# Patient Record
Sex: Female | Born: 1945 | Race: White | Hispanic: No | Smoking: Never smoker
Health system: Southern US, Community
[De-identification: ages and names within clinical notes are randomized; demographics above are authoritative.]

## PROBLEM LIST (undated history)

## (undated) DIAGNOSIS — I639 Cerebral infarction, unspecified: Secondary | ICD-10-CM

## (undated) DIAGNOSIS — T7840XA Allergy, unspecified, initial encounter: Secondary | ICD-10-CM

## (undated) DIAGNOSIS — F419 Anxiety disorder, unspecified: Secondary | ICD-10-CM

## (undated) DIAGNOSIS — I10 Essential (primary) hypertension: Secondary | ICD-10-CM

## (undated) DIAGNOSIS — Z8489 Family history of other specified conditions: Secondary | ICD-10-CM

## (undated) HISTORY — PX: OTHER SURGICAL HISTORY: SHX169

## (undated) HISTORY — DX: Allergy, unspecified, initial encounter: T78.40XA

## (undated) HISTORY — PX: BUNIONECTOMY: SHX129

## (undated) HISTORY — PX: BREAST SURGERY: SHX581

## (undated) HISTORY — PX: FRACTURE SURGERY: SHX138

## (undated) HISTORY — DX: Cerebral infarction, unspecified: I63.9

## (undated) HISTORY — DX: Anxiety disorder, unspecified: F41.9

## (undated) HISTORY — DX: Essential (primary) hypertension: I10

## (undated) HISTORY — PX: REDUCTION MAMMAPLASTY: SUR839

## (undated) HISTORY — PX: COSMETIC SURGERY: SHX468

---

## 1988-11-18 DIAGNOSIS — I639 Cerebral infarction, unspecified: Secondary | ICD-10-CM

## 1988-11-18 HISTORY — DX: Cerebral infarction, unspecified: I63.9

## 1991-11-19 HISTORY — PX: BREAST REDUCTION SURGERY: SHX8

## 2003-11-19 HISTORY — PX: ABDOMINOPLASTY: SUR9

## 2007-12-10 ENCOUNTER — Ambulatory Visit (HOSPITAL_BASED_OUTPATIENT_CLINIC_OR_DEPARTMENT_OTHER): Admission: RE | Admit: 2007-12-10 | Discharge: 2007-12-11 | Payer: Self-pay | Admitting: Orthopedic Surgery

## 2007-12-10 ENCOUNTER — Encounter (INDEPENDENT_AMBULATORY_CARE_PROVIDER_SITE_OTHER): Payer: Self-pay | Admitting: Orthopedic Surgery

## 2011-04-02 NOTE — Op Note (Signed)
Debra Roth, Debra Roth              ACCOUNT NO.:  [id number]   MEDICAL RECORD NO.:  [id number]          PATIENT TYPE:  AMB   LOCATION:  [facility]                         FACILITY:  [facility]   PHYSICIAN:  Deidre Ala, M.D.    DATE OF BIRTH:  1946-10-01   DATE OF PROCEDURE:  12/10/2007  DATE OF DISCHARGE:                               OPERATIVE REPORT   PREOPERATIVE DIAGNOSES:  1. 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  2. Second and third metatarsalgia over long second and third toes with      transfer lesion.  3. Morton's neuroma II-III again bilaterally.   POSTOPERATIVE DIAGNOSES:  1. 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  2. Second and third metatarsalgia over long second and third toes with      transfer lesion.  3. Morton's neuroma II-III again bilaterally.   PROCEDURE:  1. Right bunionectomy with first metatarsal opening wedge osteotomy      with exostosis as autograph with placement of Synthes locking LCP      plate.  2. Right second dorsal wedge osteotomy with local autograft.  3. Right third dorsal wedge osteotomy metatarsal neck with autograft.  4. Right 2-3 Morton's neurectomy.  5. Left bunionectomy with first metatarsal opening wedge osteotomy      with placement of Synthes locking LCP plate.  6. Second dorsal wedge osteotomy metatarsal neck with local autograft.  7. Third dorsal wedge osteotomy metatarsal neck with local autograft,  8. Morton's neurectomy left second and third interspace.   SURGEON:  Doristine Section, M.D.   ASSISTANT:  Phineas Semen, P.A.-C.   ANESTHESIA:  General with LMA with local block of Marcaine  postoperative.   ESTIMATED BLOOD LOSS:  Minimal.   TOURNIQUET TIME:  Right 1 hour 44 minutes; left 1 hour 40 minutes.   PATHOLOGIC FINDINGS AND HISTORY:  Miyako presented with these feet.  She is from [place] and is a friend of one my patient's.  She wanted  something through the holidays for  help with her second and third  metatarsal areas, and I injected them.  We evaluated her overall  situation, and she had metatarsus primus varus bilaterally about 13.5  degrees over long second and third toes with shortening of the first  ray.  There was some concentric subluxation of the MTP joint but still  intact with a large exostosis again without MTP joint osteoarthritis.  We talked about procedures, and she elected to proceed with the above-  listed procedure with all aspects being covered with the patient as well  as any alternatives.  She elected to go with the above procedure.  Prior  to surgery today, she complained of numbness in the second and third  interspace with direct pinch and pressure together between the second  and third.  In addition, pressure under the second and third dorsal  callosities indicating that in addition to second and third  metatarsalgia, she also had interdigital neuritis between 2 and 3.  She  desired to proceed with Morton's neurectomy after thorough explanation  of the issues with that procedure.  At surgery, no untoward features  were noted.  We were used intraoperative fluoro, and we obtained  excellent position and alignment with a dorsomedially placed LCP locking  Synthes plate with a total of 5 screws with the osteotomy at the  proximal one-third on both sides opening wedge using the exostosis on  both sides as the graft for the opening wedge with dorsal wedge  osteotomies of 2 and 3 with local autograft placed.  There was no  significant hammertoes that needed to be dealt with, and an overall  alignment that was achieved was very good on C-arm fluoroscopy on AP and  lateral view.   PROCEDURE:  With adequate anesthesia obtained using LMA technique, 1 g  Ancef given IV prophylaxis after the first case and the next one after  the second case.  The patient was placed in the supine position.  Both  lower extremities from the toes to the upper  calf were prepped and  draped in standard fashion.  After standard prepping and draping,  Esmarch examination was used on the right.  An incision was then made  over the dorsomedial capsule of the MTP joint and brought up proximally  up the metatarsal.  Incision was deepened sharply with a knife and  hemostasis obtained using the Bovie electrocoagulator.  A capsular flap  placed distal was then raised over the exostosis, and the exostosis was  removed in piecemeal fashion but basically shaved it first to be the  equivalent of a 10-degree opening wedge autograft for the osteotomy and  then the rest of the exostosis trimmed as well as the dorsal osteophyte.  This was reserved for later use.  I then made an incision at the base of  the proximal third of the diaphyseal metaphyseal junction after placing  the plate on to check positioning and left the lateral cortex open the  wedge osteotomy and placed the wedge of bone in, producing valgus from  the metatarsus primus varus original position using the exostosis wedge.  The plate was then affixed with locking screws under C-arm fluoroscopy  with 2 screws distal and 3 screws proximal.  We then did an abductor  release as per the McBride procedure with a dorsal web incision between  1 and 2 with a Beaver blade releasing the abductor.  Attention was then  turned to the second and third where a dorsal web incision was made with  dissection down through to the inner metatarsal ligament which was then  spread with a lamina spreader.  I then released the inner metatarsal  ligament exposing the digital nerve at its bifurcation.  I then  dissected the nerve proximal, pulled it distal, cut it with a Beaver  blade and allowed to retract and then resected the distal ends and sent  it for specimen.  I then did dorsal wedge osteotomies of 2 and 3 volts  at the neck, cracking the metatarsal heads up without fixation and using  morselized bone graft from the  wedge at the site.  Additional bone graft  was placed around the proximal first metatarsal.  Irrigation was carried  out.  All wounds were closed with running 4-0 nylon stitches interrupted  and running.  A bulky sterile compressive forefoot dressing was applied  and then up above the heel with slight padding between the first and  second and a great toe spica.  Quarter percent Marcaine was placed in  and about the wounds.  The patient having tolerated this  procedure had  the tourniquet let down, and the procedure was then carried out in  exactly the same fashion on the left side.  The  patient then having tolerated both procedures well was taken to the  operating room to be admitted for overnight stay in her Cam walker  boots, elevation, crutches, weightbearing as tolerated on her heels, and  told to call the office for recheck on Monday.           ______________________________  V. Charlesetta Shanks, M.D.     VEP/MEDQ  D:  12/10/2007  T:  12/10/2007  Job:  [id number]   cc:   Deidre Ala, M.D.  Fax: [phone]   Gwynneth Munson, M.D.  [place], [place]

## 2011-08-08 LAB — I-STAT 8, (EC8 V) (CONVERTED LAB)
Acid-Base Excess: 3 — ABNORMAL HIGH
BUN: 20
Bicarbonate: 28.2 — ABNORMAL HIGH
Chloride: 109
HCT: 44
Hemoglobin: 15
Operator id: 114531
Sodium: 138
pCO2, Ven: 45.4

## 2011-12-18 DIAGNOSIS — F339 Major depressive disorder, recurrent, unspecified: Secondary | ICD-10-CM | POA: Diagnosis not present

## 2012-01-08 DIAGNOSIS — G245 Blepharospasm: Secondary | ICD-10-CM | POA: Diagnosis not present

## 2012-01-16 DIAGNOSIS — I10 Essential (primary) hypertension: Secondary | ICD-10-CM | POA: Diagnosis not present

## 2012-01-16 DIAGNOSIS — E785 Hyperlipidemia, unspecified: Secondary | ICD-10-CM | POA: Diagnosis not present

## 2012-01-16 DIAGNOSIS — R12 Heartburn: Secondary | ICD-10-CM | POA: Diagnosis not present

## 2012-02-14 DIAGNOSIS — B0223 Postherpetic polyneuropathy: Secondary | ICD-10-CM | POA: Diagnosis not present

## 2012-02-14 DIAGNOSIS — B029 Zoster without complications: Secondary | ICD-10-CM | POA: Diagnosis not present

## 2012-02-19 DIAGNOSIS — L821 Other seborrheic keratosis: Secondary | ICD-10-CM | POA: Diagnosis not present

## 2012-04-08 DIAGNOSIS — G245 Blepharospasm: Secondary | ICD-10-CM | POA: Diagnosis not present

## 2012-05-20 DIAGNOSIS — R21 Rash and other nonspecific skin eruption: Secondary | ICD-10-CM | POA: Diagnosis not present

## 2012-05-20 DIAGNOSIS — I10 Essential (primary) hypertension: Secondary | ICD-10-CM | POA: Diagnosis not present

## 2012-05-20 DIAGNOSIS — Z79899 Other long term (current) drug therapy: Secondary | ICD-10-CM | POA: Diagnosis not present

## 2012-05-20 DIAGNOSIS — E785 Hyperlipidemia, unspecified: Secondary | ICD-10-CM | POA: Diagnosis not present

## 2012-07-01 DIAGNOSIS — G245 Blepharospasm: Secondary | ICD-10-CM | POA: Diagnosis not present

## 2012-07-10 DIAGNOSIS — F339 Major depressive disorder, recurrent, unspecified: Secondary | ICD-10-CM | POA: Diagnosis not present

## 2012-09-23 DIAGNOSIS — G245 Blepharospasm: Secondary | ICD-10-CM | POA: Diagnosis not present

## 2012-11-04 DIAGNOSIS — I10 Essential (primary) hypertension: Secondary | ICD-10-CM | POA: Diagnosis not present

## 2012-11-04 DIAGNOSIS — E785 Hyperlipidemia, unspecified: Secondary | ICD-10-CM | POA: Diagnosis not present

## 2013-01-06 DIAGNOSIS — M5126 Other intervertebral disc displacement, lumbar region: Secondary | ICD-10-CM | POA: Diagnosis not present

## 2013-01-07 DIAGNOSIS — G245 Blepharospasm: Secondary | ICD-10-CM | POA: Diagnosis not present

## 2013-02-04 DIAGNOSIS — F339 Major depressive disorder, recurrent, unspecified: Secondary | ICD-10-CM | POA: Diagnosis not present

## 2013-02-11 DIAGNOSIS — R404 Transient alteration of awareness: Secondary | ICD-10-CM | POA: Diagnosis not present

## 2013-02-11 DIAGNOSIS — E785 Hyperlipidemia, unspecified: Secondary | ICD-10-CM | POA: Diagnosis not present

## 2013-02-11 DIAGNOSIS — Z8679 Personal history of other diseases of the circulatory system: Secondary | ICD-10-CM | POA: Diagnosis not present

## 2013-02-11 DIAGNOSIS — R4182 Altered mental status, unspecified: Secondary | ICD-10-CM | POA: Diagnosis not present

## 2013-02-11 DIAGNOSIS — S060X9A Concussion with loss of consciousness of unspecified duration, initial encounter: Secondary | ICD-10-CM | POA: Diagnosis not present

## 2013-02-11 DIAGNOSIS — S060X1A Concussion with loss of consciousness of 30 minutes or less, initial encounter: Secondary | ICD-10-CM | POA: Diagnosis not present

## 2013-02-11 DIAGNOSIS — R51 Headache: Secondary | ICD-10-CM | POA: Diagnosis not present

## 2013-02-11 DIAGNOSIS — R55 Syncope and collapse: Secondary | ICD-10-CM | POA: Diagnosis not present

## 2013-02-11 DIAGNOSIS — W010XXA Fall on same level from slipping, tripping and stumbling without subsequent striking against object, initial encounter: Secondary | ICD-10-CM | POA: Diagnosis not present

## 2013-02-11 DIAGNOSIS — Z79899 Other long term (current) drug therapy: Secondary | ICD-10-CM | POA: Diagnosis not present

## 2013-02-11 DIAGNOSIS — I10 Essential (primary) hypertension: Secondary | ICD-10-CM | POA: Diagnosis not present

## 2013-04-08 DIAGNOSIS — G245 Blepharospasm: Secondary | ICD-10-CM | POA: Diagnosis not present

## 2013-04-26 DIAGNOSIS — J309 Allergic rhinitis, unspecified: Secondary | ICD-10-CM | POA: Diagnosis not present

## 2013-04-26 DIAGNOSIS — J019 Acute sinusitis, unspecified: Secondary | ICD-10-CM | POA: Diagnosis not present

## 2013-07-06 DIAGNOSIS — G245 Blepharospasm: Secondary | ICD-10-CM | POA: Diagnosis not present

## 2013-07-16 DIAGNOSIS — I10 Essential (primary) hypertension: Secondary | ICD-10-CM | POA: Diagnosis not present

## 2013-07-16 DIAGNOSIS — G245 Blepharospasm: Secondary | ICD-10-CM | POA: Diagnosis not present

## 2013-07-16 DIAGNOSIS — R12 Heartburn: Secondary | ICD-10-CM | POA: Diagnosis not present

## 2013-07-16 DIAGNOSIS — F329 Major depressive disorder, single episode, unspecified: Secondary | ICD-10-CM | POA: Diagnosis not present

## 2013-07-16 DIAGNOSIS — K589 Irritable bowel syndrome without diarrhea: Secondary | ICD-10-CM | POA: Diagnosis not present

## 2013-07-16 DIAGNOSIS — M899 Disorder of bone, unspecified: Secondary | ICD-10-CM | POA: Diagnosis not present

## 2013-07-16 DIAGNOSIS — E785 Hyperlipidemia, unspecified: Secondary | ICD-10-CM | POA: Diagnosis not present

## 2013-07-16 DIAGNOSIS — I6789 Other cerebrovascular disease: Secondary | ICD-10-CM | POA: Diagnosis not present

## 2013-07-16 DIAGNOSIS — I669 Occlusion and stenosis of unspecified cerebral artery: Secondary | ICD-10-CM | POA: Diagnosis not present

## 2013-10-05 DIAGNOSIS — G245 Blepharospasm: Secondary | ICD-10-CM | POA: Diagnosis not present

## 2013-11-02 DIAGNOSIS — F329 Major depressive disorder, single episode, unspecified: Secondary | ICD-10-CM | POA: Diagnosis not present

## 2013-11-02 DIAGNOSIS — I10 Essential (primary) hypertension: Secondary | ICD-10-CM | POA: Diagnosis not present

## 2013-11-02 DIAGNOSIS — E785 Hyperlipidemia, unspecified: Secondary | ICD-10-CM | POA: Diagnosis not present

## 2013-11-03 DIAGNOSIS — F339 Major depressive disorder, recurrent, unspecified: Secondary | ICD-10-CM | POA: Diagnosis not present

## 2014-01-19 DIAGNOSIS — G245 Blepharospasm: Secondary | ICD-10-CM | POA: Diagnosis not present

## 2014-05-11 DIAGNOSIS — G245 Blepharospasm: Secondary | ICD-10-CM | POA: Diagnosis not present

## 2014-06-03 ENCOUNTER — Ambulatory Visit (INDEPENDENT_AMBULATORY_CARE_PROVIDER_SITE_OTHER): Payer: Medicare Other | Admitting: Family Medicine

## 2014-06-03 VITALS — BP 142/90 | HR 69 | Temp 97.6°F | Resp 16 | Ht 61.25 in | Wt 158.0 lb

## 2014-06-03 DIAGNOSIS — K219 Gastro-esophageal reflux disease without esophagitis: Secondary | ICD-10-CM

## 2014-06-03 DIAGNOSIS — G479 Sleep disorder, unspecified: Secondary | ICD-10-CM

## 2014-06-03 DIAGNOSIS — F4323 Adjustment disorder with mixed anxiety and depressed mood: Secondary | ICD-10-CM

## 2014-06-03 DIAGNOSIS — I10 Essential (primary) hypertension: Secondary | ICD-10-CM | POA: Diagnosis not present

## 2014-06-03 DIAGNOSIS — E785 Hyperlipidemia, unspecified: Secondary | ICD-10-CM | POA: Insufficient documentation

## 2014-06-03 DIAGNOSIS — R197 Diarrhea, unspecified: Secondary | ICD-10-CM | POA: Diagnosis not present

## 2014-06-03 LAB — COMPLETE METABOLIC PANEL WITH GFR
ALT: 20 U/L (ref 0–35)
AST: 21 U/L (ref 0–37)
Albumin: 4.4 g/dL (ref 3.5–5.2)
Alkaline Phosphatase: 56 U/L (ref 39–117)
BILIRUBIN TOTAL: 0.5 mg/dL (ref 0.2–1.2)
BUN: 24 mg/dL — AB (ref 6–23)
CO2: 27 mEq/L (ref 19–32)
Calcium: 9.3 mg/dL (ref 8.4–10.5)
Chloride: 103 mEq/L (ref 96–112)
Creat: 0.86 mg/dL (ref 0.50–1.10)
GFR, EST NON AFRICAN AMERICAN: 70 mL/min
GFR, Est African American: 80 mL/min
GLUCOSE: 73 mg/dL (ref 70–99)
Potassium: 4.4 mEq/L (ref 3.5–5.3)
SODIUM: 138 meq/L (ref 135–145)
TOTAL PROTEIN: 7.2 g/dL (ref 6.0–8.3)

## 2014-06-03 LAB — CBC WITH DIFFERENTIAL/PLATELET
Basophils Absolute: 0 10*3/uL (ref 0.0–0.1)
Basophils Relative: 0 % (ref 0–1)
Eosinophils Absolute: 0.1 10*3/uL (ref 0.0–0.7)
Eosinophils Relative: 2 % (ref 0–5)
HCT: 42 % (ref 36.0–46.0)
HEMOGLOBIN: 14.7 g/dL (ref 12.0–15.0)
Lymphocytes Relative: 35 % (ref 12–46)
Lymphs Abs: 2.2 10*3/uL (ref 0.7–4.0)
MCH: 31.8 pg (ref 26.0–34.0)
MCHC: 35 g/dL (ref 30.0–36.0)
MCV: 90.9 fL (ref 78.0–100.0)
MONOS PCT: 7 % (ref 3–12)
Monocytes Absolute: 0.4 10*3/uL (ref 0.1–1.0)
Neutro Abs: 3.5 10*3/uL (ref 1.7–7.7)
Neutrophils Relative %: 56 % (ref 43–77)
PLATELETS: 167 10*3/uL (ref 150–400)
RBC: 4.62 MIL/uL (ref 3.87–5.11)
RDW: 13.1 % (ref 11.5–15.5)
WBC: 6.2 10*3/uL (ref 4.0–10.5)

## 2014-06-03 LAB — LIPID PANEL
CHOL/HDL RATIO: 2.7 ratio
CHOLESTEROL: 185 mg/dL (ref 0–200)
HDL: 68 mg/dL (ref >39)
LDL Cholesterol: 86 mg/dL (ref 0–99)
Triglycerides: 155 mg/dL — ABNORMAL HIGH (ref <150)
VLDL: 31 mg/dL (ref 0–40)

## 2014-06-03 MED ORDER — DIAZEPAM 5 MG PO TABS: 5.0000 mg | ORAL_TABLET | Freq: Two times a day (BID) | ORAL | Status: DC | PRN

## 2014-06-03 MED ORDER — RAMIPRIL 5 MG PO CAPS: 5.0000 mg | ORAL_CAPSULE | Freq: Every day | ORAL | Status: DC

## 2014-06-03 MED ORDER — BUPROPION HCL ER (XL) 150 MG PO TB24: 150.0000 mg | ORAL_TABLET | Freq: Every day | ORAL | Status: DC

## 2014-06-03 MED ORDER — METOPROLOL SUCCINATE ER 25 MG PO TB24: 25.0000 mg | ORAL_TABLET | Freq: Every day | ORAL | Status: DC

## 2014-06-03 MED ORDER — PRAVASTATIN SODIUM 20 MG PO TABS: 20.0000 mg | ORAL_TABLET | Freq: Every day | ORAL | Status: DC

## 2014-06-03 NOTE — Progress Notes (Signed)
Subjective: Patient is here for her first time. She needs a refill of her medications. She moved from [place] here 1-1/2 years ago. She had a niece here. She used to live in this area when she was married to her husband who is a Engineer, maintenance at [employer]. They got divorced and she felt it was best to get away from [place]. Since she is being here she has been fairly much of a hermit. 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 She goes down to [place] usually to see her doctor. She needs medicines refilled. She is depressed because her birthday is next week. They usually celebrated at [employer] which they have now so because of the divorce. It is an emotional time for her. She's been feeling okay otherwise. She has not get into any activities here in his community.  She takes number of medications. She is on medicine for blood pressure and cholesterol. She takes Valium for sleep. She's been having chronic diarrhea and goes about half a dozen times a day for a long time. 5 years ago she had a normal colonoscopy. She takes Imodium. She is on Wellbutrin for depression and anxiety. She takes omeprazole  over-the-counter for her GERD. She takes over-the-counter antihistamines  Objective: Healthy appearing lady who is depressed. She cried some. TMs normal. Throat clear. Neck supple without nodes thyromegaly. Chest clear. Heart regular without murmurs. Abdomen soft without mass or tenderness. Scars from a previous tummy tuck. Extremities unremarkable.  Assessment: Depression and anxiety Hypertension Hyperlipidemia Chronic diarrhea GERD Allergic rhinitis  Plan: Continue her current medications. See her back in November, sooner if problems. Next is I may be able to change to 90 day prescriptions for her, but did not want to give long-term yet. Urged her to get involved in the community and make [place] her home. Needs to get involved in church or social affairs to get  more involved. Urged to get regular exercise. Return anytime if needed.

## 2014-06-03 NOTE — Patient Instructions (Addendum)
Continue current medication  Referral is being made to a gastroenterologist for evaluation of the diarrhea  I will let you know the results of the laboratory tests sometime next week  Return at any time if worse  Try to get outside and do some regular walking  Return early November

## 2014-06-04 LAB — TSH: TSH: 1.462 u[IU]/mL (ref 0.350–4.500)

## 2014-06-06 ENCOUNTER — Telehealth: Payer: Self-pay

## 2014-06-06 DIAGNOSIS — F4323 Adjustment disorder with mixed anxiety and depressed mood: Secondary | ICD-10-CM

## 2014-06-06 DIAGNOSIS — I10 Essential (primary) hypertension: Secondary | ICD-10-CM

## 2014-06-06 DIAGNOSIS — G479 Sleep disorder, unspecified: Secondary | ICD-10-CM

## 2014-06-06 NOTE — Telephone Encounter (Signed)
metoprolol succinate (TOPROL-XL) 25 MG 24 hr tablet   Patient has not taken the extended release before,  Wants to confirms this is correct, the one she normally takes from [place] was not extended.     [phone]

## 2014-06-07 ENCOUNTER — Encounter: Payer: Self-pay | Admitting: Family Medicine

## 2014-06-07 MED ORDER — METOPROLOL TARTRATE 25 MG PO TABS: 25.0000 mg | ORAL_TABLET | Freq: Every day | ORAL | Status: DC

## 2014-06-07 NOTE — Telephone Encounter (Signed)
Spoke with pt. She says that we sent in Metoprolol XL instead of Metoprolol Tartrate. Changed for her and sent in the new rx. Pt asked about her labs. Let her know that they were normal and a letter was sent.   Pt also needs a RF of her Diazepam. Can we RF?

## 2014-06-07 NOTE — Telephone Encounter (Signed)
Patient states she has not heard anything back from [place] about previous message and medication. States she needs to know whether or not she should be taking the medication. CB # [phone]

## 2014-06-10 MED ORDER — DIAZEPAM 5 MG PO TABS: 5.0000 mg | ORAL_TABLET | Freq: Two times a day (BID) | ORAL | Status: DC | PRN

## 2014-06-10 NOTE — Telephone Encounter (Signed)
Rx in drawer for p/up.

## 2014-06-10 NOTE — Telephone Encounter (Signed)
Printed Rx for Diazepam for Dr. Linna Darner to sign.  Pt will pick up later today.

## 2014-06-10 NOTE — Telephone Encounter (Signed)
Okay to give him one refill on her diazepam

## 2014-07-19 ENCOUNTER — Telehealth: Payer: Self-pay

## 2014-07-19 NOTE — Telephone Encounter (Signed)
Patient called to see if her written prescription for "Diazepam" was still up front for her to pick up from 06/10/14. I informed patient that it was no longer up front, and it may have been shredded since it was over 30 days. Patient requesting to [facility] [address] [phone]. Patients call back number is [phone]

## 2014-07-21 ENCOUNTER — Other Ambulatory Visit: Payer: Self-pay | Admitting: Family Medicine

## 2014-07-21 DIAGNOSIS — F4323 Adjustment disorder with mixed anxiety and depressed mood: Secondary | ICD-10-CM

## 2014-07-21 DIAGNOSIS — G479 Sleep disorder, unspecified: Secondary | ICD-10-CM

## 2014-07-21 MED ORDER — DIAZEPAM 5 MG PO TABS: 5.0000 mg | ORAL_TABLET | Freq: Two times a day (BID) | ORAL | Status: DC | PRN

## 2014-07-21 NOTE — Telephone Encounter (Signed)
Pt called in for 3rd time regarding the Diazepam. It looks like in the encounters this was [facility] [place] on [facility], Pt states that she had not yet called the pharmacy about this rx, and would give it a try. I told her that if the rx is not at the pharmacy there is a message in the system for review by Dr. Linna Darner.

## 2014-07-21 NOTE — Telephone Encounter (Signed)
Informed the patient that we have provided a refill for her, but after this when she needs to be seen if she is requiring further medication.

## 2014-08-03 DIAGNOSIS — G245 Blepharospasm: Secondary | ICD-10-CM | POA: Diagnosis not present

## 2014-08-03 DIAGNOSIS — F329 Major depressive disorder, single episode, unspecified: Secondary | ICD-10-CM | POA: Diagnosis not present

## 2014-08-03 DIAGNOSIS — F3289 Other specified depressive episodes: Secondary | ICD-10-CM | POA: Diagnosis not present

## 2014-08-03 DIAGNOSIS — Z8673 Personal history of transient ischemic attack (TIA), and cerebral infarction without residual deficits: Secondary | ICD-10-CM | POA: Diagnosis not present

## 2014-08-15 ENCOUNTER — Other Ambulatory Visit: Payer: Self-pay | Admitting: Family Medicine

## 2014-08-15 ENCOUNTER — Ambulatory Visit (INDEPENDENT_AMBULATORY_CARE_PROVIDER_SITE_OTHER): Payer: Medicare Other | Admitting: Family Medicine

## 2014-08-15 VITALS — BP 138/86 | HR 62 | Temp 97.9°F | Resp 16 | Ht 61.5 in | Wt 158.0 lb

## 2014-08-15 DIAGNOSIS — F4323 Adjustment disorder with mixed anxiety and depressed mood: Secondary | ICD-10-CM | POA: Diagnosis not present

## 2014-08-15 DIAGNOSIS — G479 Sleep disorder, unspecified: Secondary | ICD-10-CM | POA: Diagnosis not present

## 2014-08-15 MED ORDER — DIAZEPAM 5 MG PO TABS: ORAL_TABLET | ORAL | Status: DC
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
## 2014-08-15 NOTE — Progress Notes (Signed)
Subjective

## 2014-08-15 NOTE — Patient Instructions (Signed)
Take one diazepam at bedtime, and one in the daytime if/when needed for anxiety or panic.  Return in 3-4 months or as needed.  Suggest Vivia Budge and George Hugh as possible clinical psychologists.

## 2014-09-19 ENCOUNTER — Emergency Department (HOSPITAL_COMMUNITY)
Admission: EM | Admit: 2014-09-19 | Discharge: 2014-09-19 | Disposition: A | Discharge status: Home / Self Care | Payer: Medicare Other | Attending: Emergency Medicine | Admitting: Emergency Medicine

## 2014-09-19 ENCOUNTER — Encounter (HOSPITAL_COMMUNITY): Payer: Self-pay | Admitting: Emergency Medicine

## 2014-09-19 DIAGNOSIS — Z79899 Other long term (current) drug therapy: Secondary | ICD-10-CM | POA: Diagnosis not present

## 2014-09-19 DIAGNOSIS — I10 Essential (primary) hypertension: Secondary | ICD-10-CM | POA: Insufficient documentation

## 2014-09-19 DIAGNOSIS — Z8673 Personal history of transient ischemic attack (TIA), and cerebral infarction without residual deficits: Secondary | ICD-10-CM | POA: Insufficient documentation

## 2014-09-19 DIAGNOSIS — R404 Transient alteration of awareness: Secondary | ICD-10-CM | POA: Diagnosis not present

## 2014-09-19 DIAGNOSIS — H81399 Other peripheral vertigo, unspecified ear: Secondary | ICD-10-CM | POA: Diagnosis not present

## 2014-09-19 DIAGNOSIS — F419 Anxiety disorder, unspecified: Secondary | ICD-10-CM | POA: Insufficient documentation

## 2014-09-19 DIAGNOSIS — R42 Dizziness and giddiness: Secondary | ICD-10-CM | POA: Diagnosis not present

## 2014-09-19 LAB — CBC WITH DIFFERENTIAL/PLATELET
Basophils Absolute: 0 10*3/uL (ref 0.0–0.1)
Basophils Relative: 0 % (ref 0–1)
EOS PCT: 2 % (ref 0–5)
Eosinophils Absolute: 0.1 10*3/uL (ref 0.0–0.7)
HEMATOCRIT: 40.1 % (ref 36.0–46.0)
HEMOGLOBIN: 14.1 g/dL (ref 12.0–15.0)
LYMPHS ABS: 1.6 10*3/uL (ref 0.7–4.0)
Lymphocytes Relative: 35 % (ref 12–46)
MCH: 32.4 pg (ref 26.0–34.0)
MCHC: 35.2 g/dL (ref 30.0–36.0)
MCV: 92.2 fL (ref 78.0–100.0)
Monocytes Absolute: 0.3 10*3/uL (ref 0.1–1.0)
Monocytes Relative: 7 % (ref 3–12)
NEUTROS PCT: 56 % (ref 43–77)
Neutro Abs: 2.6 10*3/uL (ref 1.7–7.7)
Platelets: 141 10*3/uL — ABNORMAL LOW (ref 150–400)
RBC: 4.35 MIL/uL (ref 3.87–5.11)
RDW: 11.5 % (ref 11.5–15.5)
WBC: 4.6 10*3/uL (ref 4.0–10.5)

## 2014-09-19 LAB — BASIC METABOLIC PANEL
Anion gap: 14 (ref 5–15)
BUN: 13 mg/dL (ref 6–23)
CHLORIDE: 101 meq/L (ref 96–112)
CO2: 24 meq/L (ref 19–32)
CREATININE: 0.7 mg/dL (ref 0.50–1.10)
Calcium: 9.2 mg/dL (ref 8.4–10.5)
GFR calc Af Amer: >90 mL/min (ref >90)
GFR calc non Af Amer: 86 mL/min — ABNORMAL LOW (ref >90)
GLUCOSE: 114 mg/dL — AB (ref 70–99)
Potassium: 4.4 mEq/L (ref 3.7–5.3)
Sodium: 139 mEq/L (ref 137–147)

## 2014-09-19 MED ORDER — MECLIZINE HCL 25 MG PO TABS: 25.0000 mg | ORAL_TABLET | Freq: Three times a day (TID) | ORAL | Status: DC | PRN

## 2014-09-19 MED ORDER — ONDANSETRON 4 MG PO TBDP
8.0000 mg | ORAL_TABLET | Freq: Once | ORAL | Status: AC
  Administered 2014-09-19: 8 mg via ORAL
  Filled 2014-09-19: qty 2

## 2014-09-19 MED ORDER — MECLIZINE HCL 25 MG PO TABS
25.0000 mg | ORAL_TABLET | Freq: Once | ORAL | Status: AC
  Administered 2014-09-19: 25 mg via ORAL
  Filled 2014-09-19: qty 1

## 2014-09-19 NOTE — ED Notes (Addendum)
Patient reports being dizzy since 5pm. Patient tried to lay down and see if it resolved, it didn't. EMS reports that patients manual BP was 230/110, and that patients stroke scale was negative. Patient reported chest tightness and nausea on RN's assessment. Hx of stroke in 1992 and HTN

## 2014-09-19 NOTE — ED Provider Notes (Signed)
CSN: [id number]     Arrival date & time 09/19/14  0110 History   First MD Initiated Contact with Patient 09/19/14 0235     Chief Complaint  Patient presents with  . Dizziness     (Consider location/radiation/quality/duration/timing/severity/associated sxs/prior Treatment) Patient is a 68 y.o. female presenting with dizziness. The history is provided by the patient.  Dizziness She had onset at 11 PM of dizziness described as a spinning sensation with associated nausea. Onset was after she bent over to return on her fireplace and then stood up. She notices that the dizziness is worse with movement. Even the sense of movement from watching television makes her dizziness worse. Although she has had nausea, there's been no vomiting. She is somewhat off balance when she tries to walk. She denies headache and denies hearing loss or ear pain or tinnitus.  Past Medical History  Diagnosis Date  . Allergy   . Anxiety   . Hypertension   . Stroke    Past Surgical History  Procedure Laterality Date  . Other surgical history      blephmospasms-eyes   Family History  Problem Relation Age of Onset  . Cancer Mother   . Cancer Father   . Heart disease Brother   . Hypertension Brother   . Cancer Daughter   . Hypertension Daughter   . Hyperlipidemia Brother   . Hypertension Brother    History  Substance Use Topics  . Smoking status: Never Smoker   . Smokeless tobacco: Not on file  . Alcohol Use: Yes     Comment: occasionally drinks wine   OB History    No data available     Review of Systems  Neurological: Positive for dizziness.  All other systems reviewed and are negative.     Allergies  Review of patient's allergies indicates no known allergies.  Home Medications   Prior to Admission medications   Medication Sig Start Date End Date Taking? Authorizing Provider  buPROPion (WELLBUTRIN XL) 150 MG 24 hr tablet Take 1 tablet (150 mg total) by mouth daily. 06/03/14  Yes Posey Boyer, MD  calcium carbonate (TUMS - DOSED IN MG ELEMENTAL CALCIUM) 500 MG chewable tablet Chew 2 tablets by mouth 2 (two) times daily as needed for indigestion or heartburn.    Yes Historical Provider, MD  cetirizine (ZYRTEC) 10 MG tablet Take 10 mg by mouth daily as needed for allergies.   Yes Historical Provider, MD  diazepam (VALIUM) 5 MG tablet Take one at night and one additional in daytime if needed for anxiety and panic 08/15/14  Yes Posey Boyer, MD  loperamide (IMODIUM) 2 MG capsule Take 10 mg by mouth daily as needed for diarrhea or loose stools.    Yes Historical Provider, MD  metoprolol tartrate (LOPRESSOR) 25 MG tablet Take 1 tablet (25 mg total) by mouth daily. 06/07/14  Yes Posey Boyer, MD  omeprazole (PRILOSEC) 20 MG capsule Take 20 mg by mouth daily.   Yes Historical Provider, MD  pravastatin (PRAVACHOL) 20 MG tablet Take 1 tablet (20 mg total) by mouth daily. 06/03/14  Yes Posey Boyer, MD  ramipril (ALTACE) 5 MG capsule Take 1 capsule (5 mg total) by mouth daily. 06/03/14  Yes Posey Boyer, MD  loratadine (CLARITIN) 10 MG tablet Take 10 mg by mouth daily.    Historical Provider, MD   BP 189/89 mmHg  Pulse 65  Temp(Src) 97.8 F (36.6 C) (Oral)  Resp 16  SpO2 97%  Physical Exam  Nursing note and vitals reviewed.  68 year old female, resting comfortably and in no acute distress. Vital signs are significant for hypertension. Oxygen saturation is 97%, which is normal. Head is normocephalic and atraumatic. PERRLA, EOMI. Oropharynx is clear.denies diagnoses noted on lateral gaze to the right, and this induces vertigo. Neck is nontender and supple without adenopathy or JVD. Back is nontender and there is no CVA tenderness. Lungs are clear without rales, wheezes, or rhonchi. Chest is nontender. Heart has regular rate and rhythm without murmur. Abdomen is soft, flat, nontender without masses or hepatosplenomegaly and peristalsis is normoactive. Extremities have no cyanosis  or edema, full range of motion is present. Skin is warm and dry without rash. Neurologic: Mental status is normal, cranial nerves are intact, there are no motor or sensory deficits.dizziness is reproduced by passive head movement.  ED Course  Procedures (including critical care time) Labs Review Results for orders placed or performed during the hospital encounter of 17/71/16  Basic metabolic panel  Result Value Ref Range   Sodium 139 137 - 147 mEq/L   Potassium 4.4 3.7 - 5.3 mEq/L   Chloride 101 96 - 112 mEq/L   CO2 24 19 - 32 mEq/L   Glucose, Bld 114 (H) 70 - 99 mg/dL   BUN 13 6 - 23 mg/dL   Creatinine, Ser 0.70 0.50 - 1.10 mg/dL   Calcium 9.2 8.4 - 10.5 mg/dL   GFR calc non Af Amer 86 (L) >90 mL/min   GFR calc Af Amer >90 >90 mL/min   Anion gap 14 5 - 15  CBC with Differential  Result Value Ref Range   WBC 4.6 4.0 - 10.5 K/uL   RBC 4.35 3.87 - 5.11 MIL/uL   Hemoglobin 14.1 12.0 - 15.0 g/dL   HCT 40.1 36.0 - 46.0 %   MCV 92.2 78.0 - 100.0 fL   MCH 32.4 26.0 - 34.0 pg   MCHC 35.2 30.0 - 36.0 g/dL   RDW 11.5 11.5 - 15.5 %   Platelets 141 (L) 150 - 400 K/uL   Neutrophils Relative % 56 43 - 77 %   Neutro Abs 2.6 1.7 - 7.7 K/uL   Lymphocytes Relative 35 12 - 46 %   Lymphs Abs 1.6 0.7 - 4.0 K/uL   Monocytes Relative 7 3 - 12 %   Monocytes Absolute 0.3 0.1 - 1.0 K/uL   Eosinophils Relative 2 0 - 5 %   Eosinophils Absolute 0.1 0.0 - 0.7 K/uL   Basophils Relative 0 0 - 1 %   Basophils Absolute 0.0 0.0 - 0.1 K/uL    Date: 09/19/2014  Rate: 65  Rhythm: normal sinus rhythm and premature atrial contractions (PAC)  QRS Axis: normal  Intervals: normal  ST/T Wave abnormalities: normal  Conduction Disutrbances:none  Narrative Interpretation: occasional premature atrial contraction, low voltage. No prior ECG available for comparison.  Old EKG Reviewed: none available   MDM   Final diagnoses:  Peripheral vertigo, unspecified laterality    Peripheral vertigo. Hypertension.she  will be given ondansetron for nausea and meclizine for the dizziness and reassessed.  She had excellent response to above noted medications and she is discharged with a prescription for meclizine.  Delora Fuel, MD 57/90/38 [id number]

## 2014-09-19 NOTE — Discharge Instructions (Signed)
Vertigo Vertigo means you feel like you or your surroundings are moving when they are not. Vertigo can be dangerous if it occurs when you are at work, driving, or performing difficult activities.  CAUSES  Vertigo occurs when there is a conflict of signals sent to your brain from the visual and sensory systems in your body. There are many different causes of vertigo, including:  Infections, especially in the inner ear.  A bad reaction to a drug or misuse of alcohol and medicines.  Withdrawal from drugs or alcohol.  Rapidly changing positions, such as lying down or rolling over in bed.  A migraine headache.  Decreased blood flow to the brain.  Increased pressure in the brain from a head injury, infection, tumor, or bleeding. SYMPTOMS  You may feel as though the world is spinning around or you are falling to the ground. Because your balance is upset, vertigo can cause nausea and vomiting. You may have involuntary eye movements (nystagmus). DIAGNOSIS  Vertigo is usually diagnosed by physical exam. If the cause of your vertigo is unknown, your caregiver may perform imaging tests, such as an MRI scan (magnetic resonance imaging). TREATMENT  Most cases of vertigo resolve on their own, without treatment. Depending on the cause, your caregiver may prescribe certain medicines. If your vertigo is related to body position issues, your caregiver may recommend movements or procedures to correct the problem. In rare cases, if your vertigo is caused by certain inner ear problems, you may need surgery. HOME CARE INSTRUCTIONS   Follow your caregiver's instructions.  Avoid driving.  Avoid operating heavy machinery.  Avoid performing any tasks that would be dangerous to you or others during a vertigo episode.  Tell your caregiver if you notice that certain medicines seem to be causing your vertigo. Some of the medicines used to treat vertigo episodes can actually make them worse in some people. SEEK  IMMEDIATE MEDICAL CARE IF:   Your medicines do not relieve your vertigo or are making it worse.  You develop problems with talking, walking, weakness, or using your arms, hands, or legs.  You develop severe headaches.  Your nausea or vomiting continues or gets worse.  You develop visual changes.  A family member notices behavioral changes.  Your condition gets worse. MAKE SURE YOU:  Understand these instructions.  Will watch your condition.  Will get help right away if you are not doing well or get worse. Document Released: 08/14/2005 Document Revised: 01/27/2012 Document Reviewed: 05/23/2011 [facility] Patient Information 2015 [place], [place]. This information is not intended to replace advice given to you by your health care provider. Make sure you discuss any questions you have with your health care provider.  Meclizine tablets or capsules Qu es este medicamento? La MECLIZINA es un antihistamnico. Este medicamento se [place] para prevenir nuseas, vmito o sensacin de [employer] Products asociados con los mareos provocados por el movimiento. Tambin se [place] para tratar o prevenir el vrtigo (mareo intenso o sensacin de que usted o su entorno se mueven o giran). Este medicamento puede ser utilizado para otros usos; si tiene alguna pregunta consulte con su proveedor de atencin mdica o con su farmacutico. MARCAS COMERCIALES DISPONIBLES: Antivert, Dramamine Less Drowsy, Medivert, Meni-D Qu le debo informar a mi profesional de la salud antes de tomar este medicamento? Necesita saber si usted presenta alguno de los siguientes problemas o situaciones: -asma -glaucoma -problemas de prstata -problemas estomacales -problemas urinarios -una reaccin alrgica o inusual a la meclizina, a otros medicamentos, alimentos, colorantes o  conservantes -si est embarazada o buscando quedar embarazada -si est amamantando a un beb Cmo debo utilizar este medicamento? Tome este medicamento por  va oral con un vaso de agua. Siga las instrucciones de la etiqueta del [place]. Si est tomando este medicamento para evitar los mareos por el movimiento, tome la dosis por lo menos 1 hora antes de Government social research officer. Si el Transport planner, tmelo con alimentos o con [place]. Tome sus dosis a intervalos regulares. No tome su medicamento con una frecuencia mayor a la indicada. Hable con su pediatra para informarse acerca del uso de este medicamento en nios. Puede requerir atencin especial. Sobredosis: Pngase en contacto inmediatamente con un centro toxicolgico o una sala de urgencia si usted cree que haya tomado demasiado medicamento. ATENCIN: [employer] es solo para usted. No comparta este medicamento con nadie. Qu sucede si me olvido de una dosis? Si olvida una dosis, tmela lo antes posible. Si es casi la hora de la prxima dosis, tome slo esa dosis. No tome dosis adicionales o dobles. Qu puede interactuar con este medicamento? -barbitricos para inducir el sueo o para el tratamiento de convulsiones -digoxina -medicamentos para la ansiedad o para problemas para conciliar el sueo, tales como el alprazolam, diazepam o temazepam -medicamentos para la fiebre del heno y Programmer, applications -medicamentos para la depresin mental -medicamentos para movimientos anormales, como los causados por la enfermedad de Retail buyer, o para problemas estomacales -analgsicos -medicamentos para Media planner los msculos Puede ser que esta lista no menciona todas las posibles interacciones. Informe a su profesional de [employer]	[place] de [employer] productos a base de hierbas, medicamentos de [place] o suplementos nutritivos que est tomando. Si usted fuma, consume bebidas alcohlicas o si utiliza drogas ilegales, indqueselo tambin a su profesional de [employer]	[place]. Algunas sustancias pueden interactuar con su medicamento. A qu debo estar atento al usar [employer]a? Si est tomando [employer] forma regular, debe visitar a su mdico o a su profesional de la salud para chequear su evolucin peridicamente. Puede experimentar mareos, somnolencia o visin borrosa. No conduzca ni utilice maquinaria, ni haga nada que Associate Professor en estado de alerta hasta que sepa cmo le afecta este medicamento. No se siente ni se ponga de pie con rapidez, especialmente si es un paciente de edad avanzada. Esto reduce el riesgo de mareos o [employer]. El alcohol puede aumentar los [place]. Evite consumir bebidas alcohlicas. Se le podr secar la boca. Masticar chicle sin azcar, chupar caramelos duros y tomar agua en abundancia le ayudar a mantener la boca hmeda. Si el problema no desaparece o es severo, consulte a su mdico. Este medicamento puede resecarle los ojos y provocar visin borrosa. Si [place] lentes de contacto, puede sentir ciertas molestias. Las gotas lubricantes pueden ayudarle. Si el problema no desaparece o es severo, consulte a su mdico. Qu efectos secundarios puedo tener al [employer] este medicamento? Efectos secundarios que debe informar a su mdico o a Barrister's clerk de la salud tan pronto como sea posible: -desmayos -pulso cardiaco rpido o irregular Efectos secundarios que, por lo general, no requieren atencin mdica (debe informarlos a su mdico o a su profesional de la salud si persisten o si son molestos): -estreimiento -dificultad para orinar -dificultad para dormir -dolor de cabeza -Higher education careers adviser Puede ser que esta lista no menciona todos los posibles efectos secundarios. Comunquese a su mdico por asesoramiento mdico [employer]. Usted puede informar los efectos secundarios a la FDA por telfono al 1-800-FDA-1088.  Dnde debo guardar mi medicina? Mantngala fuera del alcance de los nios. [place] a [employer], entre 15 y 106 grados C (15 y 74 grados F). Mantenga el envase bien cerrado. Deseche los medicamentos que no haya  utilizado, despus de la fecha de vencimiento. ATENCIN: Este folleto es un resumen. Puede ser que no cubra toda la posible informacin. Si usted tiene preguntas acerca de esta medicina, consulte con su mdico, su farmacutico o su profesional de Technical sales engineer.  2015, Elsevier/Gold Standard. (2005-01-31 10:35:00)

## 2014-09-19 NOTE — ED Notes (Signed)
Patient reports having a stroke in 1992 and states that she has a slight deficit on the left side.

## 2014-09-26 ENCOUNTER — Other Ambulatory Visit: Payer: Self-pay | Admitting: Family Medicine

## 2014-10-24 ENCOUNTER — Other Ambulatory Visit: Payer: Self-pay | Admitting: Family Medicine

## 2014-10-24 ENCOUNTER — Other Ambulatory Visit: Payer: Self-pay | Admitting: Physician Assistant

## 2014-11-01 ENCOUNTER — Telehealth: Payer: Self-pay | Admitting: Family Medicine

## 2014-11-01 NOTE — Telephone Encounter (Signed)
Left a message for patient to come by and get a flu shot or call and update chart with flu shot information

## 2014-11-02 DIAGNOSIS — G245 Blepharospasm: Secondary | ICD-10-CM | POA: Diagnosis not present

## 2014-11-15 ENCOUNTER — Other Ambulatory Visit: Payer: Self-pay | Admitting: Family Medicine

## 2014-11-30 ENCOUNTER — Other Ambulatory Visit: Payer: Self-pay | Admitting: Family Medicine

## 2014-12-01 NOTE — Telephone Encounter (Signed)
Patient was to come in when running out of these medicines.  Needs OV

## 2014-12-02 ENCOUNTER — Telehealth: Payer: Self-pay

## 2014-12-02 NOTE — Telephone Encounter (Signed)
Pt called regarding rx that was denied. Advised of Dr. Clayborn Heron message that she would need an OV and patient understood. Pt will come in to see Dr. Linna Darner on Monday.

## 2014-12-05 ENCOUNTER — Ambulatory Visit (INDEPENDENT_AMBULATORY_CARE_PROVIDER_SITE_OTHER): Payer: Medicare Other | Admitting: Family Medicine

## 2014-12-05 VITALS — BP 132/78 | HR 65 | Temp 97.6°F | Resp 16 | Ht 61.0 in | Wt 162.4 lb

## 2014-12-05 DIAGNOSIS — F4323 Adjustment disorder with mixed anxiety and depressed mood: Secondary | ICD-10-CM | POA: Diagnosis not present

## 2014-12-05 DIAGNOSIS — G479 Sleep disorder, unspecified: Secondary | ICD-10-CM

## 2014-12-05 DIAGNOSIS — E785 Hyperlipidemia, unspecified: Secondary | ICD-10-CM | POA: Diagnosis not present

## 2014-12-05 DIAGNOSIS — I10 Essential (primary) hypertension: Secondary | ICD-10-CM | POA: Diagnosis not present

## 2014-12-05 MED ORDER — DIAZEPAM 5 MG PO TABS: ORAL_TABLET | ORAL | Status: DC

## 2014-12-05 MED ORDER — METOPROLOL TARTRATE 25 MG PO TABS: 25.0000 mg | ORAL_TABLET | Freq: Every day | ORAL | Status: DC

## 2014-12-05 MED ORDER — BUPROPION HCL ER (XL) 150 MG PO TB24: ORAL_TABLET | ORAL | Status: DC

## 2014-12-05 MED ORDER — PRAVASTATIN SODIUM 20 MG PO TABS: ORAL_TABLET | ORAL | Status: DC

## 2014-12-05 MED ORDER — RAMIPRIL 5 MG PO CAPS: 5.0000 mg | ORAL_CAPSULE | Freq: Every day | ORAL | Status: DC

## 2014-12-05 NOTE — Patient Instructions (Addendum)
Continue using the Valium at bedtime and sparingly in the daytime  Return if problems. Otherwise plan to come back in about 6 months.  Continue taking the same blood pressure medication and cholesterol medication  Get regular exercise  Work on trying to learn to eat less to lose some weight. That will make you feel better also.

## 2014-12-05 NOTE — Progress Notes (Addendum)
Subjective: Patient is tired from waiting a long time. She has no major acute complaints. She needs follow-up on her blood pressure and cholesterol. She takes her medicines faithfully. She takes the Valium one every night and 1 occasionally in the daytime when she is feeling worse and more panicky. She takes Wellbutrin 1 daily. She needs refills on her medications. She has no other major acute complaints. She is in the emergency room one time with some dizziness.  Objective: Pleasant lady in no acute distress. She has gained weight. Her neck was supple without nodes. Chest clear. Heart regular without murmurs.  Assessment: History of anxiety and depression and panic Hypertension Hyperlipidemia  Plan: Work hard on exercise and weight loss. Continue her same medication regimen. See her back in about 6 months. Check labs today and I will let you know the results of them.

## 2014-12-06 LAB — LIPID PANEL
CHOL/HDL RATIO: 4.3 ratio
Cholesterol: 211 mg/dL — ABNORMAL HIGH (ref 0–200)
HDL: 49 mg/dL (ref >39)
LDL CALC: 104 mg/dL — AB (ref 0–99)
Triglycerides: 289 mg/dL — ABNORMAL HIGH (ref <150)
VLDL: 58 mg/dL — ABNORMAL HIGH (ref 0–40)

## 2014-12-06 LAB — COMPREHENSIVE METABOLIC PANEL
ALBUMIN: 4.2 g/dL (ref 3.5–5.2)
ALT: 26 U/L (ref 0–35)
AST: 25 U/L (ref 0–37)
Alkaline Phosphatase: 65 U/L (ref 39–117)
BILIRUBIN TOTAL: 0.5 mg/dL (ref 0.2–1.2)
BUN: 18 mg/dL (ref 6–23)
CO2: 25 mEq/L (ref 19–32)
CREATININE: 0.77 mg/dL (ref 0.50–1.10)
Calcium: 9.1 mg/dL (ref 8.4–10.5)
Chloride: 103 mEq/L (ref 96–112)
Glucose, Bld: 72 mg/dL (ref 70–99)
Potassium: 4.2 mEq/L (ref 3.5–5.3)
Sodium: 139 mEq/L (ref 135–145)
Total Protein: 7.2 g/dL (ref 6.0–8.3)

## 2014-12-07 ENCOUNTER — Telehealth: Payer: Self-pay

## 2014-12-07 NOTE — Telephone Encounter (Signed)
Results were given by Rebekah.

## 2014-12-07 NOTE — Telephone Encounter (Signed)
Pt missed her phone call  concerning her lab results. Please advise at [phone]

## 2014-12-13 NOTE — Telephone Encounter (Signed)
See other phone message. Pt notified to RTC

## 2014-12-22 DIAGNOSIS — R197 Diarrhea, unspecified: Secondary | ICD-10-CM | POA: Diagnosis not present

## 2014-12-28 ENCOUNTER — Other Ambulatory Visit: Payer: Self-pay | Admitting: Family Medicine

## 2014-12-29 ENCOUNTER — Other Ambulatory Visit: Payer: Self-pay | Admitting: Family Medicine

## 2014-12-30 DIAGNOSIS — D126 Benign neoplasm of colon, unspecified: Secondary | ICD-10-CM | POA: Diagnosis not present

## 2014-12-30 DIAGNOSIS — R197 Diarrhea, unspecified: Secondary | ICD-10-CM | POA: Diagnosis not present

## 2014-12-30 DIAGNOSIS — D123 Benign neoplasm of transverse colon: Secondary | ICD-10-CM | POA: Diagnosis not present

## 2015-01-31 DIAGNOSIS — G245 Blepharospasm: Secondary | ICD-10-CM | POA: Diagnosis not present

## 2015-04-27 DIAGNOSIS — G245 Blepharospasm: Secondary | ICD-10-CM | POA: Diagnosis not present

## 2015-05-01 ENCOUNTER — Encounter: Payer: Self-pay | Admitting: *Deleted

## 2015-05-29 ENCOUNTER — Other Ambulatory Visit: Payer: Self-pay | Admitting: Family Medicine

## 2015-06-02 ENCOUNTER — Ambulatory Visit (INDEPENDENT_AMBULATORY_CARE_PROVIDER_SITE_OTHER): Payer: Medicare Other | Admitting: Physician Assistant

## 2015-06-02 VITALS — BP 120/76 | HR 73 | Temp 98.8°F | Resp 18 | Ht 61.56 in | Wt 159.0 lb

## 2015-06-02 DIAGNOSIS — E785 Hyperlipidemia, unspecified: Secondary | ICD-10-CM

## 2015-06-02 DIAGNOSIS — Z8619 Personal history of other infectious and parasitic diseases: Secondary | ICD-10-CM

## 2015-06-02 DIAGNOSIS — G479 Sleep disorder, unspecified: Secondary | ICD-10-CM | POA: Diagnosis not present

## 2015-06-02 DIAGNOSIS — L309 Dermatitis, unspecified: Secondary | ICD-10-CM

## 2015-06-02 DIAGNOSIS — I10 Essential (primary) hypertension: Secondary | ICD-10-CM | POA: Diagnosis not present

## 2015-06-02 DIAGNOSIS — F4323 Adjustment disorder with mixed anxiety and depressed mood: Secondary | ICD-10-CM | POA: Diagnosis not present

## 2015-06-02 MED ORDER — BUPROPION HCL ER (XL) 150 MG PO TB24: 150.0000 mg | ORAL_TABLET | Freq: Every day | ORAL | Status: DC

## 2015-06-02 MED ORDER — DIAZEPAM 5 MG PO TABS: ORAL_TABLET | ORAL | Status: DC

## 2015-06-02 NOTE — Progress Notes (Signed)
[facility] [address] [place] [address] [phone]  Date:  06/02/2015   Name:  Debra Roth   DOB:  1946-04-22   MRN:  [id number]  PCP:  Ruben Reason, MD    Chief Complaint: Medication Refill and Follow-up   History of Present Illness:  This is a 69 y.o. female with PMH anxiety, depression, HTN, HLD who is presenting needing a refill of her valium. She moved here from [place] in the past year to be closer to her children. She has been seeing Dr. Linna Darner since moving here. She states she is flying to [place] next week and has severe anxiety about flying and will need the valium. She states generally she takes 1 tab QHS and 2-4 extra tabs per month for panic attacks. States anxiety got much worse several years ago after divorcing her husband. Things are somewhat better now that she has moved. She is also needing a refill of her wellbutrin today which works well for her.   HTN: stable on meds. Will get 6 month blood work today. Good on refills until next year.  HLD: stable on meds. Did not fast today. She will return for lipid panel.  Pt also complaining of a pruritic spot on her left upper back x 2 weeks. She states several years ago she was diagnosed on shingles on her right abdomen and right back. Later she also got a lesion on her left upper back. She states her doc in [place] told her "if I got other outbreaks I would need a work up for immunosuppression". She never got other outbreaks and has not had recurrences. She is worried this pruritic spot in a shingles recurrence. Not having pain, fever or chills.  Review of Systems:  Review of Systems See HPI  Patient Active Problem List   Diagnosis Date Noted  . Adjustment disorder with mixed anxiety and depressed mood 06/03/2014  . Essential hypertension 06/03/2014  . Gastroesophageal reflux disease without esophagitis 06/03/2014  . Diarrhea 06/03/2014  . Sleep disturbance 06/03/2014  . Hyperlipidemia  06/03/2014    Prior to Admission medications   Medication Sig Start Date End Date Taking? Authorizing Provider  buPROPion (WELLBUTRIN XL) 150 MG 24 hr tablet Take 1 tablet (150 mg total) by mouth daily. 12/30/14  Yes Posey Boyer, MD  calcium carbonate (TUMS - DOSED IN MG ELEMENTAL CALCIUM) 500 MG chewable tablet Chew 2 tablets by mouth 2 (two) times daily as needed for indigestion or heartburn.    Yes Historical Provider, MD  cetirizine (ZYRTEC) 10 MG tablet Take 10 mg by mouth daily as needed for allergies.   Yes Historical Provider, MD  diazepam (VALIUM) 5 MG tablet Take one at night and one additional in daytime if needed for anxiety and panic 12/05/14  Yes Posey Boyer, MD  loperamide (IMODIUM) 2 MG capsule Take 10 mg by mouth daily as needed for diarrhea or loose stools.    Yes Historical Provider, MD  metoprolol tartrate (LOPRESSOR) 25 MG tablet Take 1 tablet (25 mg total) by mouth daily. 12/05/14  Yes Posey Boyer, MD  omeprazole (PRILOSEC) 20 MG capsule Take 20 mg by mouth daily.   Yes Historical Provider, MD  pravastatin (PRAVACHOL) 20 MG tablet TAKE 1 TABLET (20 MG TOTAL) BY MOUTH DAILY. 12/05/14  Yes Posey Boyer, MD  ramipril (ALTACE) 5 MG capsule Take 1 capsule (5 mg total) by mouth daily. 12/05/14  Yes Posey Boyer, MD  loratadine [facility])  10 MG tablet Take 10 mg by mouth daily.    Historical Provider, MD    No Known Allergies  Past Surgical History  Procedure Laterality Date  . Other surgical history      blephmospasms-eyes    History  Substance Use Topics  . Smoking status: Never Smoker   . Smokeless tobacco: Never Used  . Alcohol Use: 0.0 oz/week    0 Standard drinks or equivalent per week     Comment: occasionally drinks wine    Family History  Problem Relation Age of Onset  . Cancer Mother   . Cancer Father   . Heart disease Brother   . Hypertension Brother   . Cancer Daughter   . Hypertension Daughter   . Hyperlipidemia Brother   . Hypertension  Brother     Medication list has been reviewed and updated.  Physical Examination:  Physical Exam  Constitutional: She is oriented to person, place, and time. She appears well-developed and well-nourished. No distress.  HENT:  Head: Normocephalic and atraumatic.  Right Ear: Hearing normal.  Left Ear: Hearing normal.  Nose: Nose normal.  Eyes: Conjunctivae and lids are normal. Right eye exhibits no discharge. Left eye exhibits no discharge. No scleral icterus.  Neck: Carotid bruit is not present. No thyromegaly present.  Cardiovascular: Normal rate, regular rhythm, normal heart sounds and normal pulses.   No murmur heard. Pulmonary/Chest: Effort normal and breath sounds normal. No respiratory distress. She has no wheezes. She has no rhonchi. She has no rales.  Musculoskeletal: Normal range of motion.  Lymphadenopathy:       Head (right side): No submental, no submandibular and no tonsillar adenopathy present.       Head (left side): No submental, no submandibular and no tonsillar adenopathy present.    She has no cervical adenopathy.  Neurological: She is alert and oriented to person, place, and time.  Skin: Skin is warm, dry and intact.  Dry patch with scattered scabbing over left upper back  Psychiatric: She has a normal mood and affect. Her speech is normal and behavior is normal. Thought content normal.   BP 120/76 mmHg  Pulse 73  Temp(Src) 98.8 F (37.1 C) (Oral)  Resp 18  Ht 5' 1.56" (1.564 m)  Wt 159 lb (72.122 kg)  BMI 29.48 kg/m2  SpO2 96%  Assessment and Plan:  1. Adjustment disorder with mixed anxiety and depressed mood 2. Sleep disturbance Meds refilled. Follow up in 6 months. - buPROPion (WELLBUTRIN XL) 150 MG 24 hr tablet; Take 1 tablet (150 mg total) by mouth daily.  Dispense: 90 tablet; Refill: 1 - diazepam (VALIUM) 5 MG tablet; Take one at night and one additional in daytime if needed for anxiety and panic  Dispense: 60 tablet; Refill: 3  3. Essential  hypertension Stable. Will return for fasting labs. Continue current meds. - Comprehensive metabolic panel; Future  4. Hyperlipidemia Stable. Will return for fasting labs. Continue current meds. - Lipid panel; Future  5. Dermatitis 6. History of shingles Lesion does not appear herpetic. Has been around for 2 weeks now - outbreak would have likely occurred by now. Will treat as dermatitis with OTC hydrocortisone. Return if worsening/not improving.   Benjaman Pott Drenda Freeze, MHS [facility]  06/08/2015

## 2015-06-02 NOTE — Patient Instructions (Signed)
I have refilled your valium and wellbutrin. Return next January for refills on other medications. Return after your trip for fasting blood work - orders are in the computer. Try cortisone cream on itchy area on back and see if it helps. If spot worsens and you want to try oral valtrex for possible shingles outbreak, call and let me know and I will send to pharmacy. Return with further problems/concerns.

## 2015-06-08 DIAGNOSIS — Z8619 Personal history of other infectious and parasitic diseases: Secondary | ICD-10-CM | POA: Insufficient documentation

## 2015-07-02 ENCOUNTER — Other Ambulatory Visit: Payer: Self-pay | Admitting: Family Medicine

## 2015-07-06 NOTE — Progress Notes (Signed)
  Medical screening examination/treatment/procedure(s) were performed by non-physician practitioner and as supervising physician I was immediately available for consultation/collaboration.     

## 2015-07-26 DIAGNOSIS — G245 Blepharospasm: Secondary | ICD-10-CM | POA: Diagnosis not present

## 2015-10-26 DIAGNOSIS — R030 Elevated blood-pressure reading, without diagnosis of hypertension: Secondary | ICD-10-CM | POA: Diagnosis not present

## 2015-10-26 DIAGNOSIS — G245 Blepharospasm: Secondary | ICD-10-CM | POA: Diagnosis not present

## 2015-10-26 DIAGNOSIS — Z8673 Personal history of transient ischemic attack (TIA), and cerebral infarction without residual deficits: Secondary | ICD-10-CM | POA: Diagnosis not present

## 2015-12-19 ENCOUNTER — Ambulatory Visit (INDEPENDENT_AMBULATORY_CARE_PROVIDER_SITE_OTHER): Payer: Medicare Other | Admitting: Physician Assistant

## 2015-12-19 ENCOUNTER — Encounter: Payer: Self-pay | Admitting: Physician Assistant

## 2015-12-19 VITALS — BP 142/82 | HR 63 | Temp 98.0°F | Resp 16 | Ht 61.5 in | Wt 159.0 lb

## 2015-12-19 DIAGNOSIS — E785 Hyperlipidemia, unspecified: Secondary | ICD-10-CM

## 2015-12-19 DIAGNOSIS — F4323 Adjustment disorder with mixed anxiety and depressed mood: Secondary | ICD-10-CM | POA: Diagnosis not present

## 2015-12-19 DIAGNOSIS — G479 Sleep disorder, unspecified: Secondary | ICD-10-CM

## 2015-12-19 DIAGNOSIS — Z0000 Encounter for general adult medical examination without abnormal findings: Secondary | ICD-10-CM

## 2015-12-19 DIAGNOSIS — F419 Anxiety disorder, unspecified: Secondary | ICD-10-CM | POA: Diagnosis not present

## 2015-12-19 DIAGNOSIS — I10 Essential (primary) hypertension: Secondary | ICD-10-CM | POA: Diagnosis not present

## 2015-12-19 DIAGNOSIS — L989 Disorder of the skin and subcutaneous tissue, unspecified: Secondary | ICD-10-CM

## 2015-12-19 LAB — COMPREHENSIVE METABOLIC PANEL
ALT: 27 U/L (ref 6–29)
AST: 37 U/L — ABNORMAL HIGH (ref 10–35)
Albumin: 3.9 g/dL (ref 3.6–5.1)
Alkaline Phosphatase: 51 U/L (ref 33–130)
BUN: 17 mg/dL (ref 7–25)
CALCIUM: 9.3 mg/dL (ref 8.6–10.4)
CHLORIDE: 102 mmol/L (ref 98–110)
CO2: 22 mmol/L (ref 20–31)
Creat: 0.82 mg/dL (ref 0.60–0.93)
GLUCOSE: 91 mg/dL (ref 65–99)
Potassium: 4.2 mmol/L (ref 3.5–5.3)
Sodium: 134 mmol/L — ABNORMAL LOW (ref 135–146)
Total Bilirubin: 0.7 mg/dL (ref 0.2–1.2)
Total Protein: 7.3 g/dL (ref 6.1–8.1)

## 2015-12-19 LAB — LIPID PANEL
CHOL/HDL RATIO: 3 ratio (ref <=5.0)
Cholesterol: 197 mg/dL (ref 125–200)
HDL: 66 mg/dL (ref >=46)
LDL Cholesterol: 111 mg/dL (ref <130)
Triglycerides: 99 mg/dL (ref <150)
VLDL: 20 mg/dL (ref <30)

## 2015-12-19 LAB — CBC
HEMATOCRIT: 42.8 % (ref 36.0–46.0)
HEMOGLOBIN: 14.5 g/dL (ref 12.0–15.0)
MCH: 31.3 pg (ref 26.0–34.0)
MCHC: 33.9 g/dL (ref 30.0–36.0)
MCV: 92.4 fL (ref 78.0–100.0)
MPV: 10.6 fL (ref 8.6–12.4)
Platelets: 147 10*3/uL — ABNORMAL LOW (ref 150–400)
RBC: 4.63 MIL/uL (ref 3.87–5.11)
RDW: 12.6 % (ref 11.5–15.5)
WBC: 5.2 10*3/uL (ref 4.0–10.5)

## 2015-12-19 LAB — TSH: TSH: 1.295 u[IU]/mL (ref 0.350–4.500)

## 2015-12-19 MED ORDER — DIAZEPAM 5 MG PO TABS: ORAL_TABLET | ORAL | Status: DC

## 2015-12-19 MED ORDER — RAMIPRIL 5 MG PO CAPS: 5.0000 mg | ORAL_CAPSULE | Freq: Every day | ORAL | Status: DC

## 2015-12-19 MED ORDER — BUPROPION HCL ER (XL) 150 MG PO TB24: 150.0000 mg | ORAL_TABLET | Freq: Every day | ORAL | Status: DC

## 2015-12-19 MED ORDER — PRAVASTATIN SODIUM 20 MG PO TABS: ORAL_TABLET | ORAL | Status: DC

## 2015-12-19 MED ORDER — METOPROLOL TARTRATE 25 MG PO TABS: 25.0000 mg | ORAL_TABLET | Freq: Every day | ORAL | Status: DC

## 2015-12-19 NOTE — Progress Notes (Signed)
[facility] [address], [place] [address] [phone]  Date:  12/19/2015   Name:  Debra Roth   DOB:  01/09/1946   MRN:  [id number]  PCP:  Ruben Reason, MD    Chief Complaint: Annual Exam and Medication Refill   History of Present Illness:  This is a 70 y.o. female with PMH anxiety, depression, HTN, HLD, GERD, allergic rhinitis who is presenting for CPE.  Anxiety and depression - on wellbutrin. Takes diazepam 5 mg QHS and maybe takes a couple times during the month for a panic attack. States going to [employer] makes her anxious. In general, leaving her house makes her anxious. States she feels dizzy and panicky. She moved here several years ago to get away from her divorced husband. She is from [place]. She states she missed [place] very much and regrets coming here. Excited to go to [place] next week. Will be staying 2-3 weeks. She does not exercise but knows this would be good for her mental health. She wants to start.   HTN - taking metoprolol and ramipril. Has a machine at home but hasn't used in a while. BP a little elevated today to 142/82. Has had normal readings here in the past.  HLD - takes pravachol 20 mg. Needs lipid panel today. She is fasting.  Last pap: 10 years ago. Does not want a pap smear. Does not want std testing. Immunizations: does not want flu shot today. Doesn't want shingles vaccine. Does not want pneumonia vaccine. Dentist: last went 3 years ago. Knows she needs to go. Eye: didn't bring glasses today. Had lasik 15 years ago. Needs reading glasses now. Been a few years since had eye exam. Fam hx: HTN both brothers and sister, mom died from lung cancer age 96, dad died of prostate cancer age 26. Tobacco/alcohol/substance use: no/5 drinks per week/no  Mammogram: 10 years ago. Does not want to do this now. Colonoscopy: 2016 due to bowel change. 10 year cycle Dexa: never. Does not want this.  Review of Systems:  Review of Systems   Constitutional: Negative.   HENT: Negative.   Eyes: Negative.   Respiratory: Negative.   Cardiovascular: Negative.   Gastrointestinal: Positive for diarrhea (when anxious). Negative for vomiting and abdominal pain.  Endocrine: Negative.   Genitourinary: Negative.   Musculoskeletal: Negative.   Skin: Negative.   Allergic/Immunologic: Positive for environmental allergies. Negative for food allergies and immunocompromised state.  Neurological: Negative.   Hematological: Negative.   Psychiatric/Behavioral: Negative for sleep disturbance. The patient is nervous/anxious.    Patient Active Problem List   Diagnosis Date Noted  . History of shingles 06/08/2015  . Adjustment disorder with mixed anxiety and depressed mood 06/03/2014  . Essential hypertension 06/03/2014  . Gastroesophageal reflux disease without esophagitis 06/03/2014  . Diarrhea 06/03/2014  . Sleep disturbance 06/03/2014  . Hyperlipidemia 06/03/2014    Prior to Admission medications   Medication Sig Start Date End Date Taking? Authorizing Provider  buPROPion (WELLBUTRIN XL) 150 MG 24 hr tablet Take 1 tablet (150 mg total) by mouth daily. 06/02/15  Yes Bennett Scrape V, PA-C  calcium carbonate (TUMS - DOSED IN MG ELEMENTAL CALCIUM) 500 MG chewable tablet Chew 2 tablets by mouth 2 (two) times daily as needed for indigestion or heartburn.    Yes Historical Provider, MD  cetirizine (ZYRTEC) 10 MG tablet Take 10 mg by mouth daily as needed for allergies.   Yes Historical Provider, MD  diazepam (VALIUM) 5 MG tablet  Take one at night and one additional in daytime if needed for anxiety and panic 06/02/15  Yes Bennett Scrape V, PA-C  loperamide (IMODIUM) 2 MG capsule Take 10 mg by mouth daily as needed for diarrhea or loose stools.    Yes Historical Provider, MD  loratadine (CLARITIN) 10 MG tablet Take 10 mg by mouth daily.   Yes Historical Provider, MD  metoprolol tartrate (LOPRESSOR) 25 MG tablet Take 1 tablet (25 mg total) by mouth daily.  12/05/14  Yes Posey Boyer, MD  omeprazole (PRILOSEC) 20 MG capsule Take 20 mg by mouth daily.   Yes Historical Provider, MD  pravastatin (PRAVACHOL) 20 MG tablet TAKE 1 TABLET (20 MG TOTAL) BY MOUTH DAILY. 12/05/14  Yes Posey Boyer, MD  ramipril (ALTACE) 5 MG capsule Take 1 capsule (5 mg total) by mouth daily. 12/05/14  Yes Posey Boyer, MD    No Known Allergies  Past Surgical History  Procedure Laterality Date  . Other surgical history      blephmospasms-eyes  . Breast surgery Bilateral     15 years ago  . Cosmetic surgery      breast reduction  . Fracture surgery Left     left arm and shoulder    Social History  Substance Use Topics  . Smoking status: Never Smoker   . Smokeless tobacco: Never Used  . Alcohol Use: 3.0 oz/week    5 Standard drinks or equivalent per week     Comment: occasionally drinks wine    Family History  Problem Relation Age of Onset  . Cancer Mother   . Cancer Father   . Heart disease Brother   . Hypertension Brother   . Cancer Daughter   . Hypertension Daughter   . Hyperlipidemia Brother   . Hypertension Brother     Medication list has been reviewed and updated.  Physical Examination:  Physical Exam  Constitutional: She is oriented to person, place, and time.  HENT:  Head: Normocephalic and atraumatic.  Right Ear: Hearing, tympanic membrane, external ear and ear canal normal.  Left Ear: Hearing, tympanic membrane, external ear and ear canal normal.  Nose: Nose normal.  Mouth/Throat: Uvula is midline, oropharynx is clear and moist and mucous membranes are normal.  Eyes: Conjunctivae, EOM and lids are normal. Pupils are equal, round, and reactive to light. Right eye exhibits no discharge. Left eye exhibits no discharge. No scleral icterus.  Neck: Trachea normal. Carotid bruit is not present. No thyromegaly present.  Cardiovascular: Normal rate, regular rhythm, normal heart sounds, intact distal pulses and normal pulses.   No murmur  heard. Pulmonary/Chest: Effort normal and breath sounds normal. She has no wheezes. She has no rhonchi. She has no rales. Right breast exhibits no inverted nipple, no mass, no nipple discharge, no skin change and no tenderness. Left breast exhibits no inverted nipple, no mass, no nipple discharge, no skin change and no tenderness. Breasts are symmetrical.  Surgical scars present on bilateral breasts  Abdominal: Soft. Normal appearance and bowel sounds are normal. She exhibits no abdominal bruit. There is no tenderness.  Genitourinary:  deferred  Musculoskeletal: Normal range of motion.  Lymphadenopathy:       Head (right side): No submental, no submandibular, no tonsillar, no preauricular, no posterior auricular and no occipital adenopathy present.       Head (left side): No submental, no submandibular, no tonsillar, no preauricular, no posterior auricular and no occipital adenopathy present.    She has no cervical  adenopathy.    She has no axillary adenopathy.       Right: No supraclavicular adenopathy present.       Left: No supraclavicular adenopathy present.  Neurological: She is alert and oriented to person, place, and time. No cranial nerve deficit. Coordination and gait normal.  Skin: Skin is warm, dry and intact.  Area the size of a baseball on mid back with scattered small black lesions. Almost looks like residue that could be scratched off, but unable. Extending from this area there are few lesions scattered over upper and lower back as well  Psychiatric: She has a normal mood and affect. Her speech is normal and behavior is normal. Thought content normal.    BP 142/82 mmHg  Pulse 63  Temp(Src) 98 F (36.7 C) (Oral)  Resp 16  Ht 5' 1.5" (1.562 m)  Wt 159 lb (72.122 kg)  BMI 29.56 kg/m2  SpO2 95%   Visual Acuity Screening   Right eye Left eye Both eyes  Without correction: 20/40 20/50 20/30   With correction:       Assessment and Plan:  1. Annual physical exam Exam and  labs today. Breast exam normal. Pt declines mammogram, dexa, pap smear. Gave contact info to schedule mammo if she chooses. Colonoscopy up to date. Declines all immunizations. Encouraged pt to exercise regularly. She wants to do this. Return in 6 months for follow up.  2. Skin lesion Strange skin lesions on back. Referred to dermatology. - Ambulatory referral to Dermatology  3. Essential hypertension BP a little elevated today. Has had normal readings in the past. Lab work pending. Refilled metoprolol and ramipril. Advised take BP at home and let me know if consistentyl >140/90. F/U 6 months. - Comprehensive metabolic panel - CBC - metoprolol tartrate (LOPRESSOR) 25 MG tablet; Take 1 tablet (25 mg total) by mouth daily.  Dispense: 90 tablet; Refill: 3 - ramipril (ALTACE) 5 MG capsule; Take 1 capsule (5 mg total) by mouth daily.  Dispense: 90 capsule; Refill: 3  4. Anxiety 5. Adjustment disorder with mixed anxiety and depressed mood 6. Sleep disturbance Refilled wellbutrin and valium. TSH pending. Encouraged her to exercise regularly. Encouraged her to look in to moving back to [place]. She misses her family and friends. Encouraged her to look into therapy, I think this would be beneficial for her. - TSH - buPROPion (WELLBUTRIN XL) 150 MG 24 hr tablet; Take 1 tablet (150 mg total) by mouth daily.  Dispense: 90 tablet; Refill: 1 - diazepam (VALIUM) 5 MG tablet; Take one at night and one additional in daytime if needed for anxiety and panic  Dispense: 60 tablet; Refill: 3  7. Hyperlipidemia - Lipid panel - pravastatin (PRAVACHOL) 20 MG tablet; TAKE 1 TABLET (20 MG TOTAL) BY MOUTH DAILY.  Dispense: 90 tablet; Refill: 3   Natacia Chaisson V. Drenda Freeze, MHS [facility]  12/22/2015

## 2015-12-19 NOTE — Patient Instructions (Signed)
All meds refilled. You will get a phone call to make an appointment with dermatology  We recommend that you schedule a mammogram for breast cancer screening. Typically, you do not need a referral to do this. Please contact a local imaging center to schedule your mammogram.  [facility] - [phone]  *ask for the Radiology Department [facility] - [phone] or [phone]  [facility] - [phone] [place] [phone] [facility] - [phone]  *ask for the [facility] - [phone]  *ask for the Radiology Department [facility] - [phone]  *ask for the [facility] - [phone]  Start exercising - try to walk 3-4 times a week. Take your blood pressure at home a few times a week. If >140/90 consistently, let me know and I will change your bp meds.

## 2015-12-24 ENCOUNTER — Other Ambulatory Visit: Payer: Self-pay | Admitting: Physician Assistant

## 2015-12-24 ENCOUNTER — Other Ambulatory Visit: Payer: Self-pay | Admitting: Family Medicine

## 2016-02-06 DIAGNOSIS — Z8673 Personal history of transient ischemic attack (TIA), and cerebral infarction without residual deficits: Secondary | ICD-10-CM | POA: Diagnosis not present

## 2016-02-06 DIAGNOSIS — G245 Blepharospasm: Secondary | ICD-10-CM | POA: Diagnosis not present

## 2016-02-12 NOTE — Addendum Note (Signed)
Addended by: Wyatt Haste on: 02/12/2016 09:24 AM   Modules accepted: Miquel Dunn

## 2016-02-27 ENCOUNTER — Encounter: Payer: Self-pay | Admitting: Physician Assistant

## 2016-02-27 DIAGNOSIS — G245 Blepharospasm: Secondary | ICD-10-CM | POA: Insufficient documentation

## 2016-03-24 ENCOUNTER — Other Ambulatory Visit: Payer: Self-pay | Admitting: Physician Assistant

## 2016-05-08 DIAGNOSIS — G245 Blepharospasm: Secondary | ICD-10-CM | POA: Diagnosis not present

## 2016-05-08 DIAGNOSIS — Z8673 Personal history of transient ischemic attack (TIA), and cerebral infarction without residual deficits: Secondary | ICD-10-CM | POA: Diagnosis not present

## 2016-05-08 DIAGNOSIS — I10 Essential (primary) hypertension: Secondary | ICD-10-CM | POA: Diagnosis not present

## 2016-06-18 ENCOUNTER — Other Ambulatory Visit: Payer: Self-pay | Admitting: Physician Assistant

## 2016-06-18 DIAGNOSIS — F4323 Adjustment disorder with mixed anxiety and depressed mood: Secondary | ICD-10-CM

## 2016-07-04 ENCOUNTER — Other Ambulatory Visit: Payer: Self-pay | Admitting: Physician Assistant

## 2016-07-04 DIAGNOSIS — G479 Sleep disorder, unspecified: Secondary | ICD-10-CM

## 2016-07-04 DIAGNOSIS — F4323 Adjustment disorder with mixed anxiety and depressed mood: Secondary | ICD-10-CM

## 2016-07-05 ENCOUNTER — Encounter: Payer: Self-pay | Admitting: Family Medicine

## 2016-07-05 ENCOUNTER — Ambulatory Visit (INDEPENDENT_AMBULATORY_CARE_PROVIDER_SITE_OTHER): Payer: Medicare Other | Admitting: Family Medicine

## 2016-07-05 VITALS — BP 142/72 | HR 64 | Temp 97.8°F | Resp 16 | Ht 61.5 in | Wt 152.2 lb

## 2016-07-05 DIAGNOSIS — M545 Low back pain, unspecified: Secondary | ICD-10-CM

## 2016-07-05 DIAGNOSIS — G479 Sleep disorder, unspecified: Secondary | ICD-10-CM | POA: Diagnosis not present

## 2016-07-05 DIAGNOSIS — F4323 Adjustment disorder with mixed anxiety and depressed mood: Secondary | ICD-10-CM | POA: Diagnosis not present

## 2016-07-05 MED ORDER — DIAZEPAM 5 MG PO TABS: ORAL_TABLET | ORAL | 5 refills | Status: AC

## 2016-07-05 NOTE — Patient Instructions (Signed)
     IF you received an x-ray today, you will receive an invoice from [facility]. Please contact [facility] at [phone] with questions or concerns regarding your invoice.   IF you received labwork today, you will receive an invoice from [facility]. Please contact [facility] at [phone] with questions or concerns regarding your invoice.   Our billing staff will not be able to assist you with questions regarding bills from these companies.  You will be contacted with the lab results as soon as they are available. The fastest way to get your results is to activate your My Chart account. Instructions are located on the last page of this paperwork. If you have not heard from us regarding the results in 2 weeks, please contact this office.      

## 2016-07-05 NOTE — Progress Notes (Signed)
Patient has been on diazepam for years and uses it for sleep.  She has been divorced and then moved to [place].  Her daughter and grandchildren live in [place] and son lives in [place].  She has some stiffness in her back.  She attributes the problem to her mattress which is uneven.  She has surgery on her low back 20 years ago.    She does stretching and yoga.    Objective:  BP (!) 142/72   Pulse 64   Temp 97.8 F (36.6 C) (Oral)   Resp 16   Ht 5' 1.5" (1.562 m)   Wt 152 lb 3.2 oz (69 kg)   SpO2 95%   BMI 28.29 kg/m   Long discussion of current social situation and patient prefers to work out the back stiffness on her own.  Assessment:  Stable woman with mild back stiffness and chronic sleep problems that have been stable with her valium.  Sleep disturbance - Plan: diazepam (VALIUM) 5 MG tablet  Midline low back pain without sciatica  Adjustment disorder with mixed anxiety and depressed mood - Plan: diazepam (VALIUM) 5 MG tablet  Robyn Haber, MD

## 2016-08-08 DIAGNOSIS — G245 Blepharospasm: Secondary | ICD-10-CM | POA: Diagnosis not present

## 2016-09-17 ENCOUNTER — Other Ambulatory Visit: Payer: Self-pay | Admitting: Physician Assistant

## 2016-09-17 DIAGNOSIS — F4323 Adjustment disorder with mixed anxiety and depressed mood: Secondary | ICD-10-CM

## 2016-11-19 DIAGNOSIS — G245 Blepharospasm: Secondary | ICD-10-CM | POA: Diagnosis not present

## 2016-11-19 DIAGNOSIS — Z8673 Personal history of transient ischemic attack (TIA), and cerebral infarction without residual deficits: Secondary | ICD-10-CM | POA: Diagnosis not present

## 2016-11-22 DIAGNOSIS — S20211A Contusion of right front wall of thorax, initial encounter: Secondary | ICD-10-CM | POA: Diagnosis not present

## 2016-12-11 DIAGNOSIS — Z1231 Encounter for screening mammogram for malignant neoplasm of breast: Secondary | ICD-10-CM | POA: Diagnosis not present

## 2016-12-11 DIAGNOSIS — F411 Generalized anxiety disorder: Secondary | ICD-10-CM | POA: Diagnosis not present

## 2016-12-11 DIAGNOSIS — E782 Mixed hyperlipidemia: Secondary | ICD-10-CM | POA: Diagnosis not present

## 2016-12-11 DIAGNOSIS — K529 Noninfective gastroenteritis and colitis, unspecified: Secondary | ICD-10-CM | POA: Diagnosis not present

## 2016-12-11 DIAGNOSIS — I10 Essential (primary) hypertension: Secondary | ICD-10-CM | POA: Diagnosis not present

## 2016-12-14 ENCOUNTER — Other Ambulatory Visit: Payer: Self-pay | Admitting: Physician Assistant

## 2016-12-14 DIAGNOSIS — E785 Hyperlipidemia, unspecified: Secondary | ICD-10-CM

## 2016-12-14 DIAGNOSIS — I10 Essential (primary) hypertension: Secondary | ICD-10-CM

## 2016-12-14 NOTE — Telephone Encounter (Signed)
06/2016 last ov 11/2015 last labs

## 2016-12-16 ENCOUNTER — Other Ambulatory Visit: Payer: Self-pay | Admitting: Physician Assistant

## 2016-12-16 ENCOUNTER — Other Ambulatory Visit: Payer: Self-pay | Admitting: Family Medicine

## 2016-12-16 DIAGNOSIS — Z1231 Encounter for screening mammogram for malignant neoplasm of breast: Secondary | ICD-10-CM

## 2016-12-16 DIAGNOSIS — F4323 Adjustment disorder with mixed anxiety and depressed mood: Secondary | ICD-10-CM

## 2016-12-23 ENCOUNTER — Ambulatory Visit
Admission: RE | Admit: 2016-12-23 | Discharge: 2016-12-23 | Disposition: A | Discharge status: Home / Self Care | Payer: Medicare Other | Source: Ambulatory Visit | Attending: Family Medicine | Admitting: Family Medicine

## 2016-12-23 DIAGNOSIS — Z1231 Encounter for screening mammogram for malignant neoplasm of breast: Secondary | ICD-10-CM

## 2016-12-25 ENCOUNTER — Other Ambulatory Visit: Payer: Self-pay | Admitting: Family Medicine

## 2016-12-25 ENCOUNTER — Other Ambulatory Visit: Payer: Self-pay | Admitting: Family

## 2016-12-25 DIAGNOSIS — R928 Other abnormal and inconclusive findings on diagnostic imaging of breast: Secondary | ICD-10-CM

## 2016-12-27 ENCOUNTER — Ambulatory Visit
Admission: RE | Admit: 2016-12-27 | Discharge: 2016-12-27 | Disposition: A | Discharge status: Home / Self Care | Payer: Medicare Other | Source: Ambulatory Visit | Attending: Family Medicine | Admitting: Family Medicine

## 2016-12-27 DIAGNOSIS — N6489 Other specified disorders of breast: Secondary | ICD-10-CM | POA: Diagnosis not present

## 2016-12-27 DIAGNOSIS — R928 Other abnormal and inconclusive findings on diagnostic imaging of breast: Secondary | ICD-10-CM

## 2016-12-27 DIAGNOSIS — R921 Mammographic calcification found on diagnostic imaging of breast: Secondary | ICD-10-CM | POA: Diagnosis not present

## 2017-01-16 ENCOUNTER — Other Ambulatory Visit: Payer: Self-pay | Admitting: Physician Assistant

## 2017-01-16 DIAGNOSIS — F4323 Adjustment disorder with mixed anxiety and depressed mood: Secondary | ICD-10-CM

## 2017-03-12 DIAGNOSIS — G245 Blepharospasm: Secondary | ICD-10-CM | POA: Diagnosis not present

## 2017-03-17 ENCOUNTER — Other Ambulatory Visit: Payer: Self-pay | Admitting: Physician Assistant

## 2017-03-17 DIAGNOSIS — I10 Essential (primary) hypertension: Secondary | ICD-10-CM

## 2017-03-17 DIAGNOSIS — E785 Hyperlipidemia, unspecified: Secondary | ICD-10-CM

## 2017-03-24 DIAGNOSIS — I10 Essential (primary) hypertension: Secondary | ICD-10-CM | POA: Diagnosis not present

## 2017-03-24 DIAGNOSIS — E782 Mixed hyperlipidemia: Secondary | ICD-10-CM | POA: Diagnosis not present

## 2017-03-24 DIAGNOSIS — Z2821 Immunization not carried out because of patient refusal: Secondary | ICD-10-CM | POA: Diagnosis not present

## 2017-03-24 DIAGNOSIS — E2839 Other primary ovarian failure: Secondary | ICD-10-CM | POA: Diagnosis not present

## 2017-03-25 ENCOUNTER — Other Ambulatory Visit: Payer: Self-pay | Admitting: Physician Assistant

## 2017-03-25 DIAGNOSIS — I10 Essential (primary) hypertension: Secondary | ICD-10-CM

## 2017-03-25 DIAGNOSIS — E785 Hyperlipidemia, unspecified: Secondary | ICD-10-CM

## 2017-03-28 ENCOUNTER — Other Ambulatory Visit: Payer: Self-pay | Admitting: Family Medicine

## 2017-03-28 DIAGNOSIS — E2839 Other primary ovarian failure: Secondary | ICD-10-CM

## 2017-06-04 DIAGNOSIS — G245 Blepharospasm: Secondary | ICD-10-CM | POA: Diagnosis not present

## 2017-06-04 DIAGNOSIS — Z09 Encounter for follow-up examination after completed treatment for conditions other than malignant neoplasm: Secondary | ICD-10-CM | POA: Diagnosis not present

## 2017-07-08 DIAGNOSIS — H25813 Combined forms of age-related cataract, bilateral: Secondary | ICD-10-CM | POA: Diagnosis not present

## 2017-07-08 DIAGNOSIS — H04123 Dry eye syndrome of bilateral lacrimal glands: Secondary | ICD-10-CM | POA: Diagnosis not present

## 2017-07-08 DIAGNOSIS — H40003 Preglaucoma, unspecified, bilateral: Secondary | ICD-10-CM | POA: Diagnosis not present

## 2017-07-09 DIAGNOSIS — R42 Dizziness and giddiness: Secondary | ICD-10-CM | POA: Diagnosis not present

## 2017-07-09 DIAGNOSIS — R269 Unspecified abnormalities of gait and mobility: Secondary | ICD-10-CM | POA: Diagnosis not present

## 2017-07-09 DIAGNOSIS — F411 Generalized anxiety disorder: Secondary | ICD-10-CM | POA: Diagnosis not present

## 2017-07-09 DIAGNOSIS — R2990 Unspecified symptoms and signs involving the nervous system: Secondary | ICD-10-CM | POA: Diagnosis not present

## 2017-07-11 ENCOUNTER — Inpatient Hospital Stay (HOSPITAL_COMMUNITY)
Admission: EM | Admit: 2017-07-11 | Discharge: 2017-07-13 | DRG: 069 | Disposition: A | Discharge status: Home / Self Care | Payer: Medicare Other | Attending: Family Medicine | Admitting: Family Medicine

## 2017-07-11 ENCOUNTER — Emergency Department (HOSPITAL_COMMUNITY): Payer: Medicare Other

## 2017-07-11 ENCOUNTER — Encounter (HOSPITAL_COMMUNITY): Payer: Self-pay | Admitting: Emergency Medicine

## 2017-07-11 DIAGNOSIS — R001 Bradycardia, unspecified: Secondary | ICD-10-CM

## 2017-07-11 DIAGNOSIS — G319 Degenerative disease of nervous system, unspecified: Secondary | ICD-10-CM | POA: Diagnosis present

## 2017-07-11 DIAGNOSIS — R4789 Other speech disturbances: Secondary | ICD-10-CM | POA: Diagnosis not present

## 2017-07-11 DIAGNOSIS — I6789 Other cerebrovascular disease: Secondary | ICD-10-CM | POA: Diagnosis not present

## 2017-07-11 DIAGNOSIS — R4781 Slurred speech: Secondary | ICD-10-CM | POA: Diagnosis not present

## 2017-07-11 DIAGNOSIS — I440 Atrioventricular block, first degree: Secondary | ICD-10-CM | POA: Diagnosis present

## 2017-07-11 DIAGNOSIS — G459 Transient cerebral ischemic attack, unspecified: Principal | ICD-10-CM | POA: Diagnosis present

## 2017-07-11 DIAGNOSIS — Z79899 Other long term (current) drug therapy: Secondary | ICD-10-CM

## 2017-07-11 DIAGNOSIS — R29818 Other symptoms and signs involving the nervous system: Secondary | ICD-10-CM | POA: Diagnosis not present

## 2017-07-11 DIAGNOSIS — F419 Anxiety disorder, unspecified: Secondary | ICD-10-CM | POA: Diagnosis present

## 2017-07-11 DIAGNOSIS — Z8249 Family history of ischemic heart disease and other diseases of the circulatory system: Secondary | ICD-10-CM

## 2017-07-11 DIAGNOSIS — G458 Other transient cerebral ischemic attacks and related syndromes: Secondary | ICD-10-CM

## 2017-07-11 DIAGNOSIS — H832X9 Labyrinthine dysfunction, unspecified ear: Secondary | ICD-10-CM

## 2017-07-11 DIAGNOSIS — D696 Thrombocytopenia, unspecified: Secondary | ICD-10-CM | POA: Diagnosis present

## 2017-07-11 DIAGNOSIS — E785 Hyperlipidemia, unspecified: Secondary | ICD-10-CM | POA: Diagnosis not present

## 2017-07-11 DIAGNOSIS — I10 Essential (primary) hypertension: Secondary | ICD-10-CM | POA: Diagnosis present

## 2017-07-11 DIAGNOSIS — Z8673 Personal history of transient ischemic attack (TIA), and cerebral infarction without residual deficits: Secondary | ICD-10-CM

## 2017-07-11 DIAGNOSIS — H8190 Unspecified disorder of vestibular function, unspecified ear: Secondary | ICD-10-CM

## 2017-07-11 DIAGNOSIS — R2990 Unspecified symptoms and signs involving the nervous system: Secondary | ICD-10-CM | POA: Diagnosis not present

## 2017-07-11 DIAGNOSIS — G245 Blepharospasm: Secondary | ICD-10-CM | POA: Diagnosis present

## 2017-07-11 DIAGNOSIS — R9082 White matter disease, unspecified: Secondary | ICD-10-CM | POA: Diagnosis present

## 2017-07-11 DIAGNOSIS — Z8349 Family history of other endocrine, nutritional and metabolic diseases: Secondary | ICD-10-CM

## 2017-07-11 LAB — COMPREHENSIVE METABOLIC PANEL
ALBUMIN: 3.8 g/dL (ref 3.5–5.0)
ALT: 19 U/L (ref 14–54)
ANION GAP: 8 (ref 5–15)
AST: 22 U/L (ref 15–41)
Alkaline Phosphatase: 51 U/L (ref 38–126)
BILIRUBIN TOTAL: 0.8 mg/dL (ref 0.3–1.2)
BUN: 26 mg/dL — ABNORMAL HIGH (ref 6–20)
CO2: 26 mmol/L (ref 22–32)
Calcium: 8.8 mg/dL — ABNORMAL LOW (ref 8.9–10.3)
Chloride: 103 mmol/L (ref 101–111)
Creatinine, Ser: 0.94 mg/dL (ref 0.44–1.00)
GFR calc Af Amer: >60 mL/min (ref >60)
GFR, EST NON AFRICAN AMERICAN: 59 mL/min — AB (ref >60)
GLUCOSE: 81 mg/dL (ref 65–99)
POTASSIUM: 4.1 mmol/L (ref 3.5–5.1)
Sodium: 137 mmol/L (ref 135–145)
TOTAL PROTEIN: 7 g/dL (ref 6.5–8.1)

## 2017-07-11 LAB — PROTIME-INR
INR: 1.17
PROTHROMBIN TIME: 15 s (ref 11.4–15.2)

## 2017-07-11 LAB — DIFFERENTIAL
BASOS PCT: 0 %
Basophils Absolute: 0 10*3/uL (ref 0.0–0.1)
EOS ABS: 0.1 10*3/uL (ref 0.0–0.7)
EOS PCT: 2 %
LYMPHS ABS: 1.5 10*3/uL (ref 0.7–4.0)
Lymphocytes Relative: 27 %
Monocytes Absolute: 0.5 10*3/uL (ref 0.1–1.0)
Monocytes Relative: 9 %
NEUTROS PCT: 62 %
Neutro Abs: 3.5 10*3/uL (ref 1.7–7.7)

## 2017-07-11 LAB — CBC
HCT: 40.2 % (ref 36.0–46.0)
HEMOGLOBIN: 13.6 g/dL (ref 12.0–15.0)
MCH: 31.9 pg (ref 26.0–34.0)
MCHC: 33.8 g/dL (ref 30.0–36.0)
MCV: 94.1 fL (ref 78.0–100.0)
Platelets: 142 10*3/uL — ABNORMAL LOW (ref 150–400)
RBC: 4.27 MIL/uL (ref 3.87–5.11)
RDW: 11.9 % (ref 11.5–15.5)
WBC: 5.5 10*3/uL (ref 4.0–10.5)

## 2017-07-11 LAB — APTT: APTT: 35 s (ref 24–36)

## 2017-07-11 LAB — I-STAT TROPONIN, ED: TROPONIN I, POC: 0 ng/mL (ref 0.00–0.08)

## 2017-07-11 MED ORDER — SODIUM CHLORIDE 0.9 % IV SOLN
INTRAVENOUS | Status: DC
  Administered 2017-07-12: 01:00:00 via INTRAVENOUS

## 2017-07-11 MED ORDER — PRAVASTATIN SODIUM 20 MG PO TABS
20.0000 mg | ORAL_TABLET | Freq: Every day | ORAL | Status: DC
  Administered 2017-07-12: 20 mg via ORAL
  Filled 2017-07-11: qty 1

## 2017-07-11 MED ORDER — BUPROPION HCL ER (XL) 150 MG PO TB24
150.0000 mg | ORAL_TABLET | Freq: Every day | ORAL | Status: DC
  Administered 2017-07-12 – 2017-07-13 (×2): 150 mg via ORAL
  Filled 2017-07-11 (×2): qty 1

## 2017-07-11 MED ORDER — PANTOPRAZOLE SODIUM 40 MG PO TBEC
40.0000 mg | DELAYED_RELEASE_TABLET | Freq: Every day | ORAL | Status: DC
Start: 2017-07-12 — End: 2017-07-13
  Administered 2017-07-12 – 2017-07-13 (×2): 40 mg via ORAL
  Filled 2017-07-11 (×2): qty 1

## 2017-07-11 MED ORDER — ACETAMINOPHEN 650 MG RE SUPP: 650.0000 mg | RECTAL | Status: DC | PRN

## 2017-07-11 MED ORDER — RAMIPRIL 5 MG PO CAPS
5.0000 mg | ORAL_CAPSULE | Freq: Every day | ORAL | Status: DC
  Administered 2017-07-12 – 2017-07-13 (×2): 5 mg via ORAL
  Filled 2017-07-11 (×2): qty 1

## 2017-07-11 MED ORDER — CALCIUM CARBONATE ANTACID 500 MG PO CHEW
2.0000 | CHEWABLE_TABLET | Freq: Every day | ORAL | Status: DC
  Filled 2017-07-11: qty 2

## 2017-07-11 MED ORDER — LORATADINE 10 MG PO TABS
10.0000 mg | ORAL_TABLET | Freq: Every day | ORAL | Status: DC
  Administered 2017-07-12 – 2017-07-13 (×2): 10 mg via ORAL
  Filled 2017-07-11 (×2): qty 1

## 2017-07-11 MED ORDER — ASPIRIN 300 MG RE SUPP: 300.0000 mg | Freq: Every day | RECTAL | Status: DC

## 2017-07-11 MED ORDER — LORAZEPAM 2 MG/ML IJ SOLN
1.0000 mg | Freq: Once | INTRAMUSCULAR | Status: AC
  Administered 2017-07-11: 1 mg via INTRAVENOUS
  Filled 2017-07-11: qty 1

## 2017-07-11 MED ORDER — ENOXAPARIN SODIUM 40 MG/0.4ML ~~LOC~~ SOLN
40.0000 mg | SUBCUTANEOUS | Status: DC
  Filled 2017-07-11 (×2): qty 0.4

## 2017-07-11 MED ORDER — ACETAMINOPHEN 160 MG/5ML PO SOLN: 650.0000 mg | ORAL | Status: DC | PRN

## 2017-07-11 MED ORDER — ASPIRIN 325 MG PO TABS
325.0000 mg | ORAL_TABLET | Freq: Every day | ORAL | Status: DC
  Administered 2017-07-12 – 2017-07-13 (×2): 325 mg via ORAL
  Filled 2017-07-11 (×2): qty 1

## 2017-07-11 MED ORDER — METOPROLOL TARTRATE 25 MG PO TABS
25.0000 mg | ORAL_TABLET | Freq: Every day | ORAL | Status: DC
  Administered 2017-07-12 (×2): 25 mg via ORAL
  Filled 2017-07-11 (×2): qty 1

## 2017-07-11 MED ORDER — ACETAMINOPHEN 325 MG PO TABS
650.0000 mg | ORAL_TABLET | ORAL | Status: DC | PRN
  Administered 2017-07-12: 650 mg via ORAL
  Filled 2017-07-11: qty 2

## 2017-07-11 MED ORDER — DIAZEPAM 5 MG PO TABS
5.0000 mg | ORAL_TABLET | Freq: Every day | ORAL | Status: DC
  Administered 2017-07-12 (×2): 5 mg via ORAL
  Filled 2017-07-11 (×2): qty 1

## 2017-07-11 MED ORDER — STROKE: EARLY STAGES OF RECOVERY BOOK
Freq: Once | Status: AC
  Administered 2017-07-11
  Filled 2017-07-11: qty 1

## 2017-07-11 NOTE — ED Notes (Signed)
Patient transported to X-ray 

## 2017-07-11 NOTE — ED Triage Notes (Signed)
Per EMS: Pt has had slurred speech x 20 hours and CP x 1 week.  CP 3/10, tightness, epigastric.  Hx of anxiety. 324 ASP, 2 nitro. NSR A&Ox4. Pt currently c/o nothing.  210/100 down to 160/99

## 2017-07-11 NOTE — ED Notes (Signed)
Pt c/o being claustrophobic, this RN went to MRI to give IV meds. Wasted in front of Ambulance person.

## 2017-07-11 NOTE — H&P (Signed)
History and Physical    Debra Roth:[id number] DOB: 06-16-1946 DOA: 07/11/2017  PCP: Bartholome Bill, MD  Patient coming from: Home.  Chief Complaint: Difficulty speaking.  HPI: Debra Roth is a 71 y.o. female with history of previous stroke in the 87s which affected her left side, hypertension, blepharospasm and anxiety was referred to the ER by patient's PCP after patient was found to have difficulty speaking. Patient states over the last 1 week patient has been having increasing dizziness with spinning sensation. Patient also have some blurred vision for which patient had followed up with her ophthalmologist 3 days ago and was told that she may have had possible glaucoma. Patient's primary care was planned to get MRI done as outpatient for her symptoms but yesterday around 2 PM patient started developing difficulty bringing out words. Denies any weakness of the upper or lower extremities or any difficulty swallowing. PCP referred her to ER. Patient states her blood pressure also has been high.  ED Course: Her symptoms resolved by 1 hour and on-call neurologist was consulted and patient is being admitted for possible TIA. On my exam patient is back to her baseline. Moves all extremities. MRI brain only showed old lacunar infarct.  Review of Systems: As per HPI, rest all negative.   Past Medical History:  Diagnosis Date  . Allergy   . Anxiety   . Hypertension   . Stroke [facility]) 1990    Past Surgical History:  Procedure Laterality Date  . BREAST REDUCTION SURGERY Bilateral 1993  . BREAST SURGERY Bilateral    15 years ago  . COSMETIC SURGERY     breast reduction  . FRACTURE SURGERY Left    left arm and shoulder  . OTHER SURGICAL HISTORY     blephmospasms-eyes  . REDUCTION MAMMAPLASTY       reports that she has never smoked. She has never used smokeless tobacco. She reports that she drinks about 3.0 oz of alcohol per week . She reports that she does not use  drugs.  Allergies  Allergen Reactions  . Pollen Extract Other (See Comments)    Seasonal allergies    Family History  Problem Relation Age of Onset  . Cancer Mother   . Cancer Father   . Heart disease Brother   . Hypertension Brother   . Hyperlipidemia Brother   . Hypertension Brother   . Cancer Daughter   . Hypertension Daughter     Prior to Admission medications   Medication Sig Start Date End Date Taking? Authorizing Provider  botulinum toxin Type A (BOTOX) 100 units SOLR injection Inject 33.5 Units as directed every 3 (three) months. 08/08/16  Yes [provider]  buPROPion (WELLBUTRIN XL) 150 MG 24 hr tablet TAKE 1 TABLET (150 MG TOTAL) BY MOUTH DAILY. 12/16/16  Yes Tereasa Coop, PA-C  calcium carbonate (TUMS - DOSED IN MG ELEMENTAL CALCIUM) 500 MG chewable tablet Chew 2 tablets by mouth at bedtime.    Yes [provider]  cetirizine (ZYRTEC) 10 MG tablet Take 10 mg by mouth daily as needed for allergies.   Yes [provider]  diazepam (VALIUM) 5 MG tablet Take one at night and one additional in daytime if needed for anxiety and panic Patient taking differently: Take 5 mg by mouth daily. Take one at night and one additional in daytime if needed for anxiety and panic 07/05/16  Yes Robyn Haber, MD  loperamide (IMODIUM) 2 MG capsule Take 10 mg by mouth  daily as needed for diarrhea or loose stools.    Yes [provider]  metoprolol tartrate (LOPRESSOR) 25 MG tablet TAKE 1 TABLET (25 MG TOTAL) BY MOUTH DAILY. Patient taking differently: Take 25 mg by mouth at bedtime.  12/14/16  Yes Jeffery, Chelle, PA-C  Multiple Vitamins-Minerals (DRY EYE FORMULA PO) Place 2 drops into both eyes as needed.   Yes [provider]  omeprazole (PRILOSEC) 20 MG capsule Take 20 mg by mouth as needed.    Yes [provider]  pravastatin (PRAVACHOL) 20 MG tablet TAKE 1 TABLET (20 MG TOTAL) BY MOUTH DAILY. 12/14/16  Yes Jeffery, Chelle, PA-C    ramipril (ALTACE) 5 MG capsule TAKE 1 CAPSULE (5 MG TOTAL) BY MOUTH DAILY. 12/14/16  Yes Harrison Mons, PA-C    Physical Exam: Vitals:   07/11/17 1845 07/11/17 2115 07/11/17 2315 07/11/17 2330  BP: (!) 170/86 (!) 155/80 (!) 166/83 (!) 154/84  Pulse: 62 61 62 (!) 59  Resp: 13 17  16   Temp:      TempSrc:      SpO2: 97% 99% 91% 97%  Weight:      Height:          Constitutional: Moderately built and nourished. Vitals:   07/11/17 1845 07/11/17 2115 07/11/17 2315 07/11/17 2330  BP: (!) 170/86 (!) 155/80 (!) 166/83 (!) 154/84  Pulse: 62 61 62 (!) 59  Resp: 13 17  16   Temp:      TempSrc:      SpO2: 97% 99% 91% 97%  Weight:      Height:       Eyes: Anicteric no pallor. ENMT: No discharge from the ears eyes nose and more. Neck: No neck rigidity. No mass felt. Respiratory: No rhonchi or crepitations. Cardiovascular: S1-S2 heard no murmurs appreciated. Abdomen: Soft nontender bowel sounds present. Musculoskeletal: No edema. No joint effusion. Skin: No rash. Skin appears warm. Neurologic: Alert awake oriented to time place and person. Moves all extremities 5 x 5. No facial asymmetry. Tongue is midline. Psychiatric: Appears normal. Normal affect.   Labs on Admission: I have personally reviewed following labs and imaging studies  CBC:  Recent Labs Lab 07/11/17 1722  WBC 5.5  NEUTROABS 3.5  HGB 13.6  HCT 40.2  MCV 94.1  PLT 370*   Basic Metabolic Panel:  Recent Labs Lab 07/11/17 1722  NA 137  K 4.1  CL 103  CO2 26  GLUCOSE 81  BUN 26*  CREATININE 0.94  CALCIUM 8.8*   GFR: Estimated Creatinine Clearance: 47.8 mL/min (by C-G formula based on SCr of 0.94 mg/dL). Liver Function Tests:  Recent Labs Lab 07/11/17 1722  AST 22  ALT 19  ALKPHOS 51  BILITOT 0.8  PROT 7.0  ALBUMIN 3.8   No results for input(s): LIPASE, AMYLASE in the last 168 hours. No results for input(s): AMMONIA in the last 168 hours. Coagulation Profile:  Recent Labs Lab  07/11/17 1722  INR 1.17   Cardiac Enzymes: No results for input(s): CKTOTAL, CKMB, CKMBINDEX, TROPONINI in the last 168 hours. BNP (last 3 results) No results for input(s): PROBNP in the last 8760 hours. HbA1C: No results for input(s): HGBA1C in the last 72 hours. CBG: No results for input(s): GLUCAP in the last 168 hours. Lipid Profile: No results for input(s): CHOL, HDL, LDLCALC, TRIG, CHOLHDL, LDLDIRECT in the last 72 hours. Thyroid Function Tests: No results for input(s): TSH, T4TOTAL, FREET4, T3FREE, THYROIDAB in the last 72 hours. Anemia Panel: No results for  input(s): VITAMINB12, FOLATE, FERRITIN, TIBC, IRON, RETICCTPCT in the last 72 hours. Urine analysis: No results found for: COLORURINE, APPEARANCEUR, LABSPEC, PHURINE, GLUCOSEU, HGBUR, BILIRUBINUR, KETONESUR, PROTEINUR, UROBILINOGEN, NITRITE, LEUKOCYTESUR Sepsis Labs: @LABRCNTIP (procalcitonin:4,lacticidven:4) )No results found for this or any previous visit (from the past 240 hour(s)).   Radiological Exams on Admission: Mr Brain Wo Contrast  Result Date: 07/11/2017 CLINICAL DATA:  TIA.  Initial exam.  Slurred speech. EXAM: MRI HEAD WITHOUT CONTRAST TECHNIQUE: Multiplanar, multiecho pulse sequences of the brain and surrounding structures were obtained without intravenous contrast. COMPARISON:  CT head without contrast from the same day. FINDINGS: Brain: The lacunar infarct in the posterior limb of the right internal capsule is remote. No acute infarct, hemorrhage, or mass lesion is present. Mild periventricular and subcortical white matter changes bilaterally are slightly advanced for age. The ventricles are of normal size. The internal auditory canals are normal. The brainstem and cerebellum are within normal limits. No significant extra-axial fluid collection is present. Vascular: Flow is present in the major intracranial arteries. Skull and upper cervical spine: The skullbase is within normal limits. The craniocervical junction  is normal. The upper cervical spine is normal. Marrow signal is within normal limits. A relatively empty sella is present. Midline sagittal structures are otherwise unremarkable. Sinuses/Orbits: The paranasal sinuses and mastoid air cells are clear. The globes and orbits are within normal limits. IMPRESSION: 1. Lacunar infarct in the posterior limb of the right internal capsule is remote. 2. No acute or focal intracranial abnormality to explain the patient's symptoms. 3. Mild atrophy and white matter disease is slightly advanced for age, likely reflecting the sequela of chronic microvascular ischemia. Electronically Signed   By: San Morelle M.D.   On: 07/11/2017 20:24   Ct Head Code Stroke Wo Contrast  Result Date: 07/11/2017 CLINICAL DATA:  Code stroke. Aphasia. Slurred speech and weakness beginning earlier today. EXAM: CT HEAD WITHOUT CONTRAST TECHNIQUE: Contiguous axial images were obtained from the base of the skull through the vertex without intravenous contrast. COMPARISON:  None. FINDINGS: Brain: Mild periventricular white matter hypoattenuation is within normal limits bilaterally. A lacunar infarct involving the posterior limb of the right internal capsule appears remote, relatively low density. No other focal lesions are present in the basal ganglia. Insular ribbon is normal bilaterally. The ventricles are of normal size. No significant extra-axial fluid collection is present. The brainstem and cerebellum are normal. Vascular: No hyperdense vessel or unexpected calcification. Skull: Calvarium is unremarkable. No focal lytic or blastic lesions are present. Sinuses/Orbits: The paranasal sinuses and mastoid air cells are clear. The globes and orbits are within normal limits. ASPECTS [facility] Stroke Program Early CT Score) - Ganglionic level infarction (caudate, lentiform nuclei, internal capsule, insula, M1-M3 cortex): 7/7 - Supraganglionic infarction (M4-M6 cortex): 3/3 Total score (0-10 with 10  being normal): 10/10 IMPRESSION: 1. Lacunar infarct of the posterior limb right internal capsule appears remote 2. Otherwise normal CT appearance of the brain for age. No acute intracranial abnormality. 3. ASPECTS is 10/10 These results were called by telephone at the time of interpretation on 07/11/2017 at 5:40 pm to Dr. Varney Biles , who verbally acknowledged these results. Electronically Signed   By: San Morelle M.D.   On: 07/11/2017 17:44    EKG: Independently reviewed. Normal sinus rhythm.  Assessment/Plan Principal Problem:   TIA (transient ischemic attack) Active Problems:   Essential hypertension   Hyperlipidemia   Blepharospasm    1. TIA - patient's symptoms are concerning for TIA. On-call neurologist has been  consulted. Patient did pass swallow. Patient is on aspirin. MRI brain was negative. MRA brain 2-D echo carotid Dopplers have been ordered. Get physical therapy consult. 2. Hypertension - Patient states her blood pressure has been running high recently. Patient is on ramipril and metoprolol. Closely follow blood pressure trends. 3. Hyperlipidemia on statins. 4. Blepharospasm  - gets Botox injections. 5. History of anxiety and Valium and Wellbutrin. 6. Previous history of stroke.  I have reviewed patient's old charts and labs.  DVT prophylaxis: Lovenox. Code Status: Full code.  Family Communication: Patient's daughter.  Disposition Plan: Home.  Consults called: Neurology and physical therapy.  Admission status: Observation.    Rise Patience MD [facility] Pager [phone].  If 7PM-7AM, please contact night-coverage [email or web address] Password [facility]  07/11/2017, 11:39 PM

## 2017-07-12 ENCOUNTER — Observation Stay (HOSPITAL_COMMUNITY): Payer: Medicare Other

## 2017-07-12 ENCOUNTER — Encounter (HOSPITAL_COMMUNITY): Payer: Medicare Other

## 2017-07-12 ENCOUNTER — Other Ambulatory Visit (HOSPITAL_COMMUNITY): Payer: Medicare Other

## 2017-07-12 ENCOUNTER — Encounter (HOSPITAL_COMMUNITY): Payer: Self-pay | Admitting: *Deleted

## 2017-07-12 DIAGNOSIS — R9082 White matter disease, unspecified: Secondary | ICD-10-CM | POA: Diagnosis present

## 2017-07-12 DIAGNOSIS — Z8249 Family history of ischemic heart disease and other diseases of the circulatory system: Secondary | ICD-10-CM | POA: Diagnosis not present

## 2017-07-12 DIAGNOSIS — Z79899 Other long term (current) drug therapy: Secondary | ICD-10-CM | POA: Diagnosis not present

## 2017-07-12 DIAGNOSIS — G459 Transient cerebral ischemic attack, unspecified: Secondary | ICD-10-CM | POA: Diagnosis not present

## 2017-07-12 DIAGNOSIS — H832X9 Labyrinthine dysfunction, unspecified ear: Secondary | ICD-10-CM

## 2017-07-12 DIAGNOSIS — G458 Other transient cerebral ischemic attacks and related syndromes: Secondary | ICD-10-CM | POA: Diagnosis not present

## 2017-07-12 DIAGNOSIS — Z8673 Personal history of transient ischemic attack (TIA), and cerebral infarction without residual deficits: Secondary | ICD-10-CM | POA: Diagnosis not present

## 2017-07-12 DIAGNOSIS — G245 Blepharospasm: Secondary | ICD-10-CM | POA: Diagnosis present

## 2017-07-12 DIAGNOSIS — R001 Bradycardia, unspecified: Secondary | ICD-10-CM | POA: Diagnosis not present

## 2017-07-12 DIAGNOSIS — E785 Hyperlipidemia, unspecified: Secondary | ICD-10-CM | POA: Diagnosis present

## 2017-07-12 DIAGNOSIS — F419 Anxiety disorder, unspecified: Secondary | ICD-10-CM | POA: Diagnosis present

## 2017-07-12 DIAGNOSIS — R4781 Slurred speech: Secondary | ICD-10-CM | POA: Diagnosis not present

## 2017-07-12 DIAGNOSIS — I440 Atrioventricular block, first degree: Secondary | ICD-10-CM | POA: Diagnosis present

## 2017-07-12 DIAGNOSIS — R4789 Other speech disturbances: Secondary | ICD-10-CM | POA: Diagnosis not present

## 2017-07-12 DIAGNOSIS — I10 Essential (primary) hypertension: Secondary | ICD-10-CM | POA: Diagnosis not present

## 2017-07-12 DIAGNOSIS — Z8349 Family history of other endocrine, nutritional and metabolic diseases: Secondary | ICD-10-CM | POA: Diagnosis not present

## 2017-07-12 DIAGNOSIS — I6521 Occlusion and stenosis of right carotid artery: Secondary | ICD-10-CM | POA: Diagnosis not present

## 2017-07-12 DIAGNOSIS — D696 Thrombocytopenia, unspecified: Secondary | ICD-10-CM | POA: Diagnosis present

## 2017-07-12 DIAGNOSIS — I5030 Unspecified diastolic (congestive) heart failure: Secondary | ICD-10-CM | POA: Diagnosis not present

## 2017-07-12 DIAGNOSIS — G319 Degenerative disease of nervous system, unspecified: Secondary | ICD-10-CM | POA: Diagnosis present

## 2017-07-12 LAB — CBC
HEMATOCRIT: 39.6 % (ref 36.0–46.0)
HEMOGLOBIN: 13.2 g/dL (ref 12.0–15.0)
MCH: 31.3 pg (ref 26.0–34.0)
MCHC: 33.3 g/dL (ref 30.0–36.0)
MCV: 93.8 fL (ref 78.0–100.0)
Platelets: 136 10*3/uL — ABNORMAL LOW (ref 150–400)
RBC: 4.22 MIL/uL (ref 3.87–5.11)
RDW: 11.9 % (ref 11.5–15.5)
WBC: 4.5 10*3/uL (ref 4.0–10.5)

## 2017-07-12 LAB — RAPID URINE DRUG SCREEN, HOSP PERFORMED
AMPHETAMINES: NOT DETECTED
BENZODIAZEPINES: POSITIVE — AB
Barbiturates: NOT DETECTED
COCAINE: NOT DETECTED
Opiates: NOT DETECTED
TETRAHYDROCANNABINOL: NOT DETECTED

## 2017-07-12 LAB — COMPREHENSIVE METABOLIC PANEL
ALBUMIN: 3.5 g/dL (ref 3.5–5.0)
ALK PHOS: 49 U/L (ref 38–126)
ALT: 18 U/L (ref 14–54)
ANION GAP: 8 (ref 5–15)
AST: 21 U/L (ref 15–41)
BILIRUBIN TOTAL: 0.5 mg/dL (ref 0.3–1.2)
BUN: 20 mg/dL (ref 6–20)
CO2: 27 mmol/L (ref 22–32)
Calcium: 8.7 mg/dL — ABNORMAL LOW (ref 8.9–10.3)
Chloride: 104 mmol/L (ref 101–111)
Creatinine, Ser: 0.9 mg/dL (ref 0.44–1.00)
GFR calc Af Amer: >60 mL/min (ref >60)
GLUCOSE: 91 mg/dL (ref 65–99)
Potassium: 3.6 mmol/L (ref 3.5–5.1)
Sodium: 139 mmol/L (ref 135–145)
Total Protein: 6.4 g/dL — ABNORMAL LOW (ref 6.5–8.1)

## 2017-07-12 LAB — URINALYSIS, ROUTINE W REFLEX MICROSCOPIC
Bilirubin Urine: NEGATIVE
GLUCOSE, UA: NEGATIVE mg/dL
Hgb urine dipstick: NEGATIVE
Ketones, ur: NEGATIVE mg/dL
Nitrite: NEGATIVE
PH: 5 (ref 5.0–8.0)
Protein, ur: NEGATIVE mg/dL
SPECIFIC GRAVITY, URINE: 1.013 (ref 1.005–1.030)

## 2017-07-12 LAB — HEMOGLOBIN A1C
HEMOGLOBIN A1C: 4.9 % (ref 4.8–5.6)
MEAN PLASMA GLUCOSE: 93.93 mg/dL

## 2017-07-12 LAB — LIPID PANEL
CHOLESTEROL: 173 mg/dL (ref 0–200)
HDL: 65 mg/dL (ref >40)
LDL CALC: 86 mg/dL (ref 0–99)
TRIGLYCERIDES: 112 mg/dL (ref <150)
Total CHOL/HDL Ratio: 2.7 RATIO
VLDL: 22 mg/dL (ref 0–40)

## 2017-07-12 MED ORDER — PRAVASTATIN SODIUM 40 MG PO TABS
40.0000 mg | ORAL_TABLET | Freq: Every day | ORAL | Status: DC
  Administered 2017-07-12 – 2017-07-13 (×2): 40 mg via ORAL
  Filled 2017-07-12 (×2): qty 1

## 2017-07-12 MED ORDER — GADOBENATE DIMEGLUMINE 529 MG/ML IV SOLN
15.0000 mL | Freq: Once | INTRAVENOUS | Status: AC
  Administered 2017-07-12: 14 mL via INTRAVENOUS

## 2017-07-12 NOTE — Progress Notes (Signed)
Patient arrived to [facility] from [facility]. Patient alert and oriented x 4. Ambulated from stretcher to bed with no assist. Patient has slight left side facial droop but otherwise neuro intact. Vitals taken and stable. Telemetry monitor placed on patient. Patient oriented to room, phone and call-light.

## 2017-07-12 NOTE — Consult Note (Signed)
Referring Physician: Dr. Florene Glen    Chief Complaint: Slurred speech  HPI: Debra Roth is an 71 y.o. female who presented with slurred speech x 20 hours and CP for one week. She saw her PCP who advised her to be emergently evaluated. She also complained of vertigo, blurred vision and difficulty with word finding. Denied limb weakness or dysphagia. On arrival to the ED, her initial BP was 210/100, which decreased to 160/99. After one hour in the ED, her symptoms resolved.   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 Her PMHx also includes anxiety and HTN.   She takes pravastatin 20 mg po qd. She does not take ASA regularly.   Past Medical History:  Diagnosis Date  . Allergy   . Anxiety   . Hypertension   . Stroke [facility]) 1990    Past Surgical History:  Procedure Laterality Date  . BREAST REDUCTION SURGERY Bilateral 1993  . BREAST SURGERY Bilateral    15 years ago  . COSMETIC SURGERY     breast reduction  . FRACTURE SURGERY Left    left arm and shoulder  . OTHER SURGICAL HISTORY     blephmospasms-eyes  . REDUCTION MAMMAPLASTY      Family History  Problem Relation Age of Onset  . Cancer Mother   . Cancer Father   . Heart disease Brother   . Hypertension Brother   . Hyperlipidemia Brother   . Hypertension Brother   . Cancer Daughter   . Hypertension Daughter    Social History:  reports that she has never smoked. She has never used smokeless tobacco. She reports that she drinks about 3.0 oz of alcohol per week . She reports that she does not use drugs.  Allergies:  Allergies  Allergen Reactions  . Pollen Extract Other (See Comments)    Seasonal allergies    Medications:  Prior to Admission:  Prescriptions Prior to Admission  Medication Sig Dispense Refill Last Dose  . botulinum toxin Type A (BOTOX) 100 units SOLR injection  Inject 33.5 Units as directed every 3 (three) months.   06/06/2017  . buPROPion (WELLBUTRIN XL) 150 MG 24 hr tablet TAKE 1 TABLET (150 MG TOTAL) BY MOUTH DAILY. 30 tablet 0 07/11/2017 at Unknown time  . calcium carbonate (TUMS - DOSED IN MG ELEMENTAL CALCIUM) 500 MG chewable tablet Chew 2 tablets by mouth at bedtime.    07/10/2017 at Unknown time  . cetirizine (ZYRTEC) 10 MG tablet Take 10 mg by mouth daily as needed for allergies.   07/10/2017 at Unknown time  . diazepam (VALIUM) 5 MG tablet Take one at night and one additional in daytime if needed for anxiety and panic (Patient taking differently: Take 5 mg by mouth daily. Take one at night and one additional in daytime if needed for anxiety and panic) 60 tablet 5 07/10/2017 at Unknown time  . loperamide (IMODIUM) 2 MG capsule Take 10 mg by mouth daily as needed for diarrhea or loose stools.    Past Week at Unknown time  . metoprolol tartrate (LOPRESSOR) 25 MG tablet TAKE 1 TABLET (25 MG TOTAL) BY MOUTH DAILY. (Patient taking differently: Take 25 mg by mouth at bedtime. ) 90 tablet 0 07/10/2017 at 2300  . Multiple Vitamins-Minerals (DRY EYE FORMULA PO) Place 2 drops into both eyes as needed.  07/11/2017 at Unknown time  . omeprazole (PRILOSEC) 20 MG capsule Take 20 mg by mouth as needed.    Past Week at Unknown time  . pravastatin (PRAVACHOL) 20 MG tablet TAKE 1 TABLET (20 MG TOTAL) BY MOUTH DAILY. 90 tablet 0 07/10/2017 at Unknown time  . ramipril (ALTACE) 5 MG capsule TAKE 1 CAPSULE (5 MG TOTAL) BY MOUTH DAILY. 90 capsule 0 07/11/2017 at Unknown time   Scheduled: . aspirin  300 mg Rectal Daily   Or  . aspirin  325 mg Oral Daily  . buPROPion  150 mg Oral Daily  . calcium carbonate  2 tablet Oral QHS  . diazepam  5 mg Oral QHS  . enoxaparin (LOVENOX) injection  40 mg Subcutaneous Q24H  . loratadine  10 mg Oral Daily  . metoprolol tartrate  25 mg Oral QHS  . pantoprazole  40 mg Oral Daily  . pravastatin  20 mg Oral q1800  . ramipril  5 mg Oral Daily    Continuous: . sodium chloride 50 mL/hr at 07/12/17 0306    ROS: As per HPI.   Physical Examination: Blood pressure 118/64, pulse (!) 54, temperature 97.6 F (36.4 C), temperature source Oral, resp. rate 16, height _0  (1.575 m), weight 68.1 kg (150 lb 2.1 oz), SpO2 99 %.  HEENT: [place]/AT Lungs: Respirations unlabored Ext: No edema  Neurologic Examination: Mental Status: Alert, oriented, thought content appropriate. Speech fluent without evidence of aphasia.  Able to follow all commands without difficulty. Cranial Nerves: II:  Visual fields intact. Left pupil slightly larger than right in the context of equal reactivity. Good eye contact. Fixates and tracks normally.  III,IV, VI: ptosis not present, EOMI without nystagmus.  V,VII: Smile symmetric, facial temp sensation normal bilaterally VIII: hearing intact to voice IX,X: No hypophonia XI: Symmetric shoulder shrug XII: midline tongue extension  Motor: Right : Upper extremity   5/5    Left:     Upper extremity   5/5  Lower extremity   5/5     Lower extremity   5/5 Normal tone throughout; no atrophy noted No pronator drift Sensory: Temp and light touch intact throughout, bilaterally. No extinction.  Deep Tendon Reflexes:  2+ bilateral upper and lower extremities. Toes equivocal.  Cerebellar: No ataxia with FNF bilaterally Gait: Deferred  Results for orders placed or performed during the hospital encounter of 07/11/17 (from the past 48 hour(s))  Protime-INR     Status: None   Collection Time: 07/11/17  5:22 PM  Result Value Ref Range   Prothrombin Time 15.0 11.4 - 15.2 seconds   INR 1.17   APTT     Status: None   Collection Time: 07/11/17  5:22 PM  Result Value Ref Range   aPTT 35 24 - 36 seconds  CBC     Status: Abnormal   Collection Time: 07/11/17  5:22 PM  Result Value Ref Range   WBC 5.5 4.0 - 10.5 K/uL   RBC 4.27 3.87 - 5.11 MIL/uL   Hemoglobin 13.6 12.0 - 15.0 g/dL   HCT 40.2 36.0 - 46.0 %   MCV 94.1 78.0 -  100.0 fL   MCH 31.9 26.0 - 34.0 pg   MCHC 33.8 30.0 - 36.0 g/dL   RDW 11.9 11.5 - 15.5 %   Platelets 142 (L) 150 - 400 K/uL  Differential     Status: None   Collection Time: 07/11/17  5:22 PM  Result Value Ref Range   Neutrophils Relative % 62 %  Neutro Abs 3.5 1.7 - 7.7 K/uL   Lymphocytes Relative 27 %   Lymphs Abs 1.5 0.7 - 4.0 K/uL   Monocytes Relative 9 %   Monocytes Absolute 0.5 0.1 - 1.0 K/uL   Eosinophils Relative 2 %   Eosinophils Absolute 0.1 0.0 - 0.7 K/uL   Basophils Relative 0 %   Basophils Absolute 0.0 0.0 - 0.1 K/uL  Comprehensive metabolic panel     Status: Abnormal   Collection Time: 07/11/17  5:22 PM  Result Value Ref Range   Sodium 137 135 - 145 mmol/L   Potassium 4.1 3.5 - 5.1 mmol/L   Chloride 103 101 - 111 mmol/L   CO2 26 22 - 32 mmol/L   Glucose, Bld 81 65 - 99 mg/dL   BUN 26 (H) 6 - 20 mg/dL   Creatinine, Ser 0.94 0.44 - 1.00 mg/dL   Calcium 8.8 (L) 8.9 - 10.3 mg/dL   Total Protein 7.0 6.5 - 8.1 g/dL   Albumin 3.8 3.5 - 5.0 g/dL   AST 22 15 - 41 U/L   ALT 19 14 - 54 U/L   Alkaline Phosphatase 51 38 - 126 U/L   Total Bilirubin 0.8 0.3 - 1.2 mg/dL   GFR calc non Af Amer 59 (L) >60 mL/min   GFR calc Af Amer >60 >60 mL/min    Comment: (NOTE) The eGFR has been calculated using the CKD EPI equation. This calculation has not been validated in all clinical situations. eGFR's persistently <60 mL/min signify possible Chronic Kidney Disease.    Anion gap 8 5 - 15  I-stat troponin, ED (not at [facility], [facility])     Status: None   Collection Time: 07/11/17  5:32 PM  Result Value Ref Range   Troponin i, poc 0.00 0.00 - 0.08 ng/mL   Comment 3            Comment: Due to the release kinetics of cTnI, a negative result within the first hours of the onset of symptoms does not rule out myocardial infarction with certainty. If myocardial infarction is still suspected, repeat the test at appropriate intervals.   Hemoglobin A1c     Status: None   Collection Time:  07/12/17  4:29 AM  Result Value Ref Range   Hgb A1c MFr Bld 4.9 4.8 - 5.6 %    Comment: (NOTE) Pre diabetes:          5.7%-6.4% Diabetes:              >6.4% Glycemic control for   <7.0% adults with diabetes    Mean Plasma Glucose 93.93 mg/dL  Lipid panel     Status: None   Collection Time: 07/12/17  4:29 AM  Result Value Ref Range   Cholesterol 173 0 - 200 mg/dL   Triglycerides 112 <150 mg/dL   HDL 65 >40 mg/dL   Total CHOL/HDL Ratio 2.7 RATIO   VLDL 22 0 - 40 mg/dL   LDL Cholesterol 86 0 - 99 mg/dL    Comment:        Total Cholesterol/HDL:CHD Risk Coronary Heart Disease Risk Table                     Men   Women  1/2 Average Risk   3.4   3.3  Average Risk       5.0   4.4  2 X Average Risk   9.6   7.1  3 X Average Risk  23.4  11.0        Use the calculated Patient Ratio above and the CHD Risk Table to determine the patient's CHD Risk.        ATP III CLASSIFICATION (LDL):  <100     mg/dL   Optimal  100-129  mg/dL   Near or Above                    Optimal  130-159  mg/dL   Borderline  160-189  mg/dL   High  >190     mg/dL   Very High   CBC     Status: Abnormal   Collection Time: 07/12/17  4:29 AM  Result Value Ref Range   WBC 4.5 4.0 - 10.5 K/uL   RBC 4.22 3.87 - 5.11 MIL/uL   Hemoglobin 13.2 12.0 - 15.0 g/dL   HCT 39.6 36.0 - 46.0 %   MCV 93.8 78.0 - 100.0 fL   MCH 31.3 26.0 - 34.0 pg   MCHC 33.3 30.0 - 36.0 g/dL   RDW 11.9 11.5 - 15.5 %   Platelets 136 (L) 150 - 400 K/uL  Comprehensive metabolic panel     Status: Abnormal   Collection Time: 07/12/17  4:29 AM  Result Value Ref Range   Sodium 139 135 - 145 mmol/L   Potassium 3.6 3.5 - 5.1 mmol/L   Chloride 104 101 - 111 mmol/L   CO2 27 22 - 32 mmol/L   Glucose, Bld 91 65 - 99 mg/dL   BUN 20 6 - 20 mg/dL   Creatinine, Ser 0.90 0.44 - 1.00 mg/dL   Calcium 8.7 (L) 8.9 - 10.3 mg/dL   Total Protein 6.4 (L) 6.5 - 8.1 g/dL   Albumin 3.5 3.5 - 5.0 g/dL   AST 21 15 - 41 U/L   ALT 18 14 - 54 U/L   Alkaline  Phosphatase 49 38 - 126 U/L   Total Bilirubin 0.5 0.3 - 1.2 mg/dL   GFR calc non Af Amer >60 >60 mL/min   GFR calc Af Amer >60 >60 mL/min    Comment: (NOTE) The eGFR has been calculated using the CKD EPI equation. This calculation has not been validated in all clinical situations. eGFR's persistently <60 mL/min signify possible Chronic Kidney Disease.    Anion gap 8 5 - 15   Mr Brain Wo Contrast  Result Date: 07/11/2017 CLINICAL DATA:  TIA.  Initial exam.  Slurred speech. EXAM: MRI HEAD WITHOUT CONTRAST TECHNIQUE: Multiplanar, multiecho pulse sequences of the brain and surrounding structures were obtained without intravenous contrast. COMPARISON:  CT head without contrast from the same day. FINDINGS: Brain: The lacunar infarct in the posterior limb of the right internal capsule is remote. No acute infarct, hemorrhage, or mass lesion is present. Mild periventricular and subcortical white matter changes bilaterally are slightly advanced for age. The ventricles are of normal size. The internal auditory canals are normal. The brainstem and cerebellum are within normal limits. No significant extra-axial fluid collection is present. Vascular: Flow is present in the major intracranial arteries. Skull and upper cervical spine: The skullbase is within normal limits. The craniocervical junction is normal. The upper cervical spine is normal. Marrow signal is within normal limits. A relatively empty sella is present. Midline sagittal structures are otherwise unremarkable. Sinuses/Orbits: The paranasal sinuses and mastoid air cells are clear. The globes and orbits are within normal limits. IMPRESSION: 1. Lacunar infarct in the posterior limb of the right internal capsule is remote. 2. No  acute or focal intracranial abnormality to explain the patient's symptoms. 3. Mild atrophy and white matter disease is slightly advanced for age, likely reflecting the sequela of chronic microvascular ischemia. Electronically  Signed   By: San Morelle M.D.   On: 07/11/2017 20:24   Ct Head Code Stroke Wo Contrast  Result Date: 07/11/2017 CLINICAL DATA:  Code stroke. Aphasia. Slurred speech and weakness beginning earlier today. EXAM: CT HEAD WITHOUT CONTRAST TECHNIQUE: Contiguous axial images were obtained from the base of the skull through the vertex without intravenous contrast. COMPARISON:  None. FINDINGS: Brain: Mild periventricular white matter hypoattenuation is within normal limits bilaterally. A lacunar infarct involving the posterior limb of the right internal capsule appears remote, relatively low density. No other focal lesions are present in the basal ganglia. Insular ribbon is normal bilaterally. The ventricles are of normal size. No significant extra-axial fluid collection is present. The brainstem and cerebellum are normal. Vascular: No hyperdense vessel or unexpected calcification. Skull: Calvarium is unremarkable. No focal lytic or blastic lesions are present. Sinuses/Orbits: The paranasal sinuses and mastoid air cells are clear. The globes and orbits are within normal limits. ASPECTS [facility] Stroke Program Early CT Score) - Ganglionic level infarction (caudate, lentiform nuclei, internal capsule, insula, M1-M3 cortex): 7/7 - Supraganglionic infarction (M4-M6 cortex): 3/3 Total score (0-10 with 10 being normal): 10/10 IMPRESSION: 1. Lacunar infarct of the posterior limb right internal capsule appears remote 2. Otherwise normal CT appearance of the brain for age. No acute intracranial abnormality. 3. ASPECTS is 10/10 These results were called by telephone at the time of interpretation on 07/11/2017 at 5:40 pm to Dr. Varney Biles , who verbally acknowledged these results. Electronically Signed   By: San Morelle M.D.   On: 07/11/2017 17:44    Assessment: 71 y.o. female with TIA symptoms 1. Neurological examination is normal.  2. MRI brain reveals a remote lacunar infarct in the posterior limb of  the right internal capsule. No acute or focal intracranial abnormality to explain the patient's symptoms. Mild atrophy and white matter disease is slightly advanced for age, likely reflecting the sequela of chronic microvascular ischemia.  3. Stroke Risk Factors - Prior history of stroke, HTN  Plan: 1. Agree with starting ASA. Continue pravastatin.  2. MRA of the brain without contrast. MRA of the neck with contrast.  3. TTE.  4. Cardiac telemetry 5. PT consult, OT consult, Speech consult 6. Out of permissive HTN time window 7. Frequent neuro checks  _0  signed: Dr. Kerney Elbe  07/12/2017, 8:00 AM

## 2017-07-12 NOTE — Progress Notes (Signed)
Inpatient Rehabilitation  Per, PT request patient was screened by Gunnar Fusi for appropriateness for an Inpatient Acute Rehab consult.  Note patient is under observation status with a remote infarct; however, given current deficits an Inpatient Rehab consult is recommended.  Discussed with Dr. Florene Glen, who will discuss with patient and order if appropriate.  Carmelia Roller., CCC/SLP Admission Coordinator  [place]  Cell [phone]

## 2017-07-12 NOTE — Evaluation (Signed)
Physical Therapy Evaluation Patient Details Name: Debra Roth MRN: [id number] DOB: 02-21-1946 Today's Date: 07/12/2017   History of Present Illness  Pt is a 71 y.o. female with PMH: CVA (11's), HTN, Anxiety who presented to hospital with slurred speech, c/o vertigo, blurred vision and difficulty with word finding. MRI did revealed remote Lacunar infarct in the posterior limb of the right internal capsule.  Clinical Impression  Pt is able to walk but very unstable, tending to need hall rail and did not want to use walker initially.  Will need to assess her for walker and for orthostasis, based on her symptoms of dizziness and her poor control of balance.  WIll progress her gait and balance skills as tolerated but may even need vestibular eval despite her lack of nystagmus with turns and mobility.    Follow Up Recommendations CIR    Equipment Recommendations  None recommended by PT    Recommendations for Other Services Rehab consult     Precautions / Restrictions Precautions Precautions: Fall Restrictions Weight Bearing Restrictions: No      Mobility  Bed Mobility Overal bed mobility: Needs Assistance Bed Mobility: Supine to Sit;Sit to Supine     Supine to sit: Min guard;Min assist Sit to supine: Min guard;Min assist   General bed mobility comments: Pt is feeling vertigo symptoms upon exiting bed, required help to return to bed due to dizziness  Transfers Overall transfer level: Needs assistance Equipment used: Rolling walker (2 wheeled);1 person hand held assist Transfers: Sit to/from Stand Sit to Stand: Min guard         General transfer comment: pt stands impulsively but after feels she is not able to do this well  Ambulation/Gait Ambulation/Gait assistance: Min guard;Min assist Ambulation Distance (Feet): 80 Feet Assistive device: 1 person hand held assist Gait Pattern/deviations: Step-through pattern;Step-to pattern;Shuffle;Decreased stride length;Narrow  base of support;Trunk flexed;Drifts right/left Gait velocity: reduced Gait velocity interpretation: Below normal speed for age/gender General Gait Details: lateral elements of instability after having stood up to walk in the hall, progressively more diffilcult  Stairs            Wheelchair Mobility    Modified Rankin (Stroke Patients Only) Modified Rankin (Stroke Patients Only) Pre-Morbid Rankin Score: No significant disability Modified Rankin: Moderately severe disability     Balance Overall balance assessment: Needs assistance Sitting-balance support: Feet supported Sitting balance-Leahy Scale: Fair     Standing balance support: Single extremity supported Standing balance-Leahy Scale: Fair                               Pertinent Vitals/Pain Pain Assessment: No/denies pain    Home Living Family/patient expects to be discharged to:: Private residence Living Arrangements: Alone Available Help at Discharge: Family;Friend(s);Available PRN/intermittently Type of Home: House Home Access: Stairs to enter Entrance Stairs-Rails: None Entrance Stairs-Number of Steps: 3 Home Layout: One level Home Equipment: None      Prior Function Level of Independence: Independent               Hand Dominance   Dominant Hand: Right    Extremity/Trunk Assessment   Upper Extremity Assessment Upper Extremity Assessment: Overall WFL for tasks assessed    Lower Extremity Assessment Lower Extremity Assessment: Generalized weakness    Cervical / Trunk Assessment Cervical / Trunk Assessment: Normal  Communication   Communication: No difficulties  Cognition Arousal/Alertness: Awake/alert Behavior During Therapy: WFL for tasks assessed/performed Overall Cognitive Status:  Within Functional Limits for tasks assessed                                        General Comments General comments (skin integrity, edema, etc.): Pt has difficulty with  controlling standing balance and had to monitor her HHA on wall, her difficulty standing with turning around and noted no nystagmus with these activities    Exercises     Assessment/Plan    PT Assessment Patient needs continued PT services  PT Problem List Decreased strength;Decreased range of motion;Decreased activity tolerance;Decreased balance;Decreased mobility;Decreased coordination;Decreased knowledge of use of DME;Decreased safety awareness;Decreased knowledge of precautions;Decreased skin integrity       PT Treatment Interventions DME instruction;Gait training;Stair training;Functional mobility training;Therapeutic activities;Therapeutic exercise;Balance training;Neuromuscular re-education;Patient/family education    PT Goals (Current goals can be found in the Care Plan section)  Acute Rehab PT Goals Patient Stated Goal: to increase her personal safety PT Goal Formulation: With patient Time For Goal Achievement: 07/26/17 Potential to Achieve Goals: Good    Frequency Min 3X/week   Barriers to discharge Decreased caregiver support;Inaccessible home environment home alone and has stairs to enter house    Co-evaluation               AM-PAC PT "6 Clicks" Daily Activity  Outcome Measure Difficulty turning over in bed (including adjusting bedclothes, sheets and blankets)?: Unable Difficulty moving from lying on back to sitting on the side of the bed? : Unable Difficulty sitting down on and standing up from a chair with arms (e.g., wheelchair, bedside commode, etc,.)?: Unable Help needed moving to and from a bed to chair (including a wheelchair)?: A Little Help needed walking in hospital room?: A Little Help needed climbing 3-5 steps with a railing? : A Lot 6 Click Score: 11    End of Session Equipment Utilized During Treatment: Gait belt Activity Tolerance: Patient limited by fatigue;Patient limited by lethargy Patient left: in bed;with call bell/phone within  reach;with nursing/sitter in room;with family/visitor present Nurse Communication: Mobility status;Other (comment) (Limits of eval due to symtoms of dizziness) PT Visit Diagnosis: Unsteadiness on feet (R26.81);Other abnormalities of gait and mobility (R26.89);Muscle weakness (generalized) (M62.81);Difficulty in walking, not elsewhere classified (R26.2)    Time: 4825-0037 PT Time Calculation (min) (ACUTE ONLY): 34 min   Charges:   PT Evaluation $PT Eval Moderate Complexity: 1 Mod PT Treatments $Gait Training: 8-22 mins   PT G Codes:   PT G-Codes **NOT FOR INPATIENT CLASS** Functional Assessment Tool Used: AM-PAC 6 Clicks Basic Mobility;Clinical judgement Functional Limitation: Mobility: Walking and moving around Mobility: Walking and Moving Around Current Status (C4888): At least 40 percent but less than 60 percent impaired, limited or restricted Mobility: Walking and Moving Around Goal Status [phone]): At least 1 percent but less than 20 percent impaired, limited or restricted    Ramond Dial 07/12/2017, 5:15 PM   Mee Hives, PT MS Acute Rehab Dept. Number: [place]

## 2017-07-12 NOTE — Evaluation (Signed)
Speech Language Pathology Evaluation Patient Details Name: Debra Roth MRN: [id number] DOB: 01/18/1946 Today's Date: 07/12/2017 Time: 5176-1607 SLP Time Calculation (min) (ACUTE ONLY): 25 min  Problem List:  Patient Active Problem List   Diagnosis Date Noted  . TIA (transient ischemic attack) 07/11/2017  . 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 History of shingles 06/08/2015  . Adjustment disorder with mixed anxiety and depressed mood 06/03/2014  . Essential hypertension 06/03/2014  . Gastroesophageal reflux disease without esophagitis 06/03/2014  . Diarrhea 06/03/2014  . Sleep disturbance 06/03/2014  . Hyperlipidemia 06/03/2014   Past Medical History:  Past Medical History:  Diagnosis Date  . Allergy   . Anxiety   . Hypertension   . Stroke [facility]) 1990   Past Surgical History:  Past Surgical History:  Procedure Laterality Date  . BREAST REDUCTION SURGERY Bilateral 1993  . BREAST SURGERY Bilateral    15 years ago  . COSMETIC SURGERY     breast reduction  . FRACTURE SURGERY Left    left arm and shoulder  . OTHER SURGICAL HISTORY     blephmospasms-eyes  . REDUCTION MAMMAPLASTY     HPI:  Pt is a 71 y.o. female with PMH: CVA (74's), HTN, Anxiety who presented to hospital with slurred speech, c/o vertigo, blurred vision and difficulty with word finding. MRI did not reveal acute intracranial abnormality.    Assessment / Plan / Recommendation Clinical Impression  Patient presents with a very mild dysarthria with decreased lingual ROM, but overall intelligibility was 100%. Patient reported that her tongue felt like it was getting "stuck" and not as precise as far as articulation. SLP observed mild decreased speech articulation but overall impact on speech intelligtibility is negligible and symptoms should resolve without direct intervention.    SLP Assessment  SLP Recommendation/Assessment: Patient does not need any further Speech Lanaguage Pathology Services SLP Visit Diagnosis:  Dysarthria and anarthria (R47.1)    Follow Up Recommendations  None    Frequency and Duration     N/A      SLP Evaluation Cognition  Overall Cognitive Status: Within Functional Limits for tasks assessed Arousal/Alertness: Awake/alert Orientation Level: Oriented X4 Memory: Appears intact Awareness: Appears intact Problem Solving: Appears intact Safety/Judgment: Appears intact       Comprehension  Auditory Comprehension Overall Auditory Comprehension: Appears within functional limits for tasks assessed    Expression Expression Primary Mode of Expression: Verbal Verbal Expression Overall Verbal Expression: Appears within functional limits for tasks assessed   Oral / Motor  Oral Motor/Sensory Function Overall Oral Motor/Sensory Function: Mild impairment Facial Symmetry: Within Functional Limits Facial Strength: Within Functional Limits Facial Sensation: Within Functional Limits Lingual ROM: Reduced right;Reduced left Lingual Strength: Reduced Lingual Sensation: Reduced Velum: Within Functional Limits Motor Speech Overall Motor Speech: Impaired Phonation: Hoarse;Other (comment) (patient reports h/o dystonia and that voice is at baseline ) Articulation: Impaired Level of Impairment: Conversation Intelligibility: Intelligible   GO          Functional Limitations: Motor speech Motor Speech Current Status [phone]): At least 1 percent but less than 20 percent impaired, limited or restricted Motor Speech Goal Status [phone]): At least 1 percent but less than 20 percent impaired, limited or restricted         Sonia Baller, [place], CCC-SLP 07/12/17 12:29 PM

## 2017-07-12 NOTE — Progress Notes (Signed)
PROGRESS NOTE    Debra Roth  BHA:[id number] DOB: Nov 29, 1945 DOA: 07/11/2017 PCP: Debra Bill, MD (Confirm with patient/family/NH records and if not entered, this HAS to be entered at [facility] point of entry. "No PCP" if truly none.)   Brief Narrative: (Start on day 1 of progress note - keep it brief and live) Debra Roth is Debra Roth 71 y.o. female with history of previous stroke in the 90s which affected her left side, hypertension, blepharospasm and anxiety was referred to the ER by patient's PCP after patient was found to have difficulty speaking. Patient states over the last 1 week patient has been having increasing dizziness with spinning sensation. Patient also have some blurred vision for which patient had followed up with her ophthalmologist 3 days ago and was told that she may have had possible glaucoma. Patient's primary care was planned to get MRI done as outpatient for her symptoms but yesterday around 2 PM patient started developing difficulty bringing out words. Denies any weakness of the upper or lower extremities or any difficulty swallowing. She was referred to the ER by her PCP.    Her sx resolved by 1 hour, she was admitted for concern for TIA (MRI brain showed old lacunar infarct).   Assessment & Plan:   Principal Problem:   TIA (transient ischemic attack) Active Problems:   Essential hypertension   Hyperlipidemia   Blepharospasm   TIA - MRI with old lacunar infarct, but no findings explaining current sx.  She's feeling mostly better now, feels maybe Debra Roth little bit of difficulty with her tongue still.   - PT/OT/Speech - nl A1c and lipids - ASA, increase pravastatin to 40 per neuro - echo pending - MRA head/neck with patent ICA, nonvisualization of L vertebral.  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   Postive UA with small LE and WBC: likely asx bacteruria, no sx.  [ ]  f/u cx   Hypertension - Patient states her blood pressure has been running high recently.  Patient is on ramipril and metoprolol. Closely follow blood pressure trends.  Hyperlipidemia pravasatin, increase to 40  Blepharospasm  - gets Botox injections.  History of anxiety and Valium and Wellbutrin.  Previous history of stroke.  Mild thrombocytopenia, ctm   DVT prophylaxis: lovenox Code Status: full  Family Communication:none Disposition Plan: dc pending echo   Consultants:   neuro  Procedures: (Roth't include imaging studies which can be auto populated. Include things that cannot be auto populated i.e. Echo, Carotid and venous dopplers, Foley, Bipap, HD, tubes/drains, wound vac, central lines etc)  Echo pending  Antimicrobials: (specify start and planned stop date. Auto populated tables are space occupying and do not give end dates)  none    Subjective: Feels stressed, upset after MRI. Some difficulty with tongue still, tired, weak, but mostly better.   Objective: Vitals:   07/12/17 1030 07/12/17 1230 07/12/17 1642 07/12/17 1738  BP: (!) 150/64 (!) 164/59 (!) 156/83 (!) 147/64  Pulse:  (!) 59  62  Resp: 16 18  18   Temp:  97.6 F (36.4 C)  98.6 F (37 C)  TempSrc: Oral Oral  Oral  SpO2:  100%  97%  Weight:      Height:        Intake/Output Summary (Last 24 hours) at 07/12/17 2058 Last data filed at 07/12/17 1700  Gross per 24 hour  Intake           773.33 ml  Output  400 ml  Net           373.33 ml   Filed Weights   07/11/17 1621 07/12/17 0030  Weight: 64.9 kg (143 lb) 68.1 kg (150 lb 2.1 oz)    Examination:  General exam: Appears calm and comfortable  Respiratory system: Clear to auscultation. Respiratory effort normal. Cardiovascular system: S1 & S2 heard, RRR. No JVD, murmurs, rubs, gallops or clicks. No pedal edema. Gastrointestinal system: Abdomen is nondistended, soft and nontender. No organomegaly or masses felt. Normal bowel sounds heard. Central nervous system: Alert and oriented. No focal neurological deficits (maybe  mild facial droop and speech changes?). Extremities: Symmetric 5 x 5 power. Skin: No rashes, lesions or ulcers Psychiatry: Judgement and insight appear normal. Mood & affect appropriate.     Data Reviewed: I have personally reviewed following labs and imaging studies  CBC:  Recent Labs Lab 07/11/17 1722 07/12/17 0429  WBC 5.5 4.5  NEUTROABS 3.5  --   HGB 13.6 13.2  HCT 40.2 39.6  MCV 94.1 93.8  PLT 142* 283*   Basic Metabolic Panel:  Recent Labs Lab 07/11/17 1722 07/12/17 0429  NA 137 139  K 4.1 3.6  CL 103 104  CO2 26 27  GLUCOSE 81 91  BUN 26* 20  CREATININE 0.94 0.90  CALCIUM 8.8* 8.7*   GFR: Estimated Creatinine Clearance: 51.1 mL/min (by C-G formula based on SCr of 0.9 mg/dL). Liver Function Tests:  Recent Labs Lab 07/11/17 1722 07/12/17 0429  AST 22 21  ALT 19 18  ALKPHOS 51 49  BILITOT 0.8 0.5  PROT 7.0 6.4*  ALBUMIN 3.8 3.5   No results for input(s): LIPASE, AMYLASE in the last 168 hours. No results for input(s): AMMONIA in the last 168 hours. Coagulation Profile:  Recent Labs Lab 07/11/17 1722  INR 1.17   Cardiac Enzymes: No results for input(s): CKTOTAL, CKMB, CKMBINDEX, TROPONINI in the last 168 hours. BNP (last 3 results) No results for input(s): PROBNP in the last 8760 hours. HbA1C:  Recent Labs  07/12/17 0429  HGBA1C 4.9   CBG: No results for input(s): GLUCAP in the last 168 hours. Lipid Profile:  Recent Labs  07/12/17 0429  CHOL 173  HDL 65  LDLCALC 86  TRIG 112  CHOLHDL 2.7   Thyroid Function Tests: No results for input(s): TSH, T4TOTAL, FREET4, T3FREE, THYROIDAB in the last 72 hours. Anemia Panel: No results for input(s): VITAMINB12, FOLATE, FERRITIN, TIBC, IRON, RETICCTPCT in the last 72 hours. Sepsis Labs: No results for input(s): PROCALCITON, LATICACIDVEN in the last 168 hours.  No results found for this or any previous visit (from the past 240 hour(s)).       Radiology Studies: Mr Jodene Nam Neck W Wo  Contrast  Result Date: 07/12/2017 CLINICAL DATA:  Transient ischemic attack with slurred speech. EXAM: MRA NECK WITHOUT AND WITH CONTRAST MRA HEAD WITHOUT CONTRAST TECHNIQUE: Multiplanar and multiecho pulse sequences of the neck were obtained without and with intravenous contrast. Angiographic images of the neck were obtained using MRA technique without and with intravenous contrast.; Angiographic images of the Circle of Willis were obtained using MRA technique without intravenous contrast. CONTRAST:  39mL MULTIHANCE GADOBENATE DIMEGLUMINE 529 MG/ML IV SOLN COMPARISON:  MR brain 07/11/2017 did not show an acute stroke. FINDINGS: MRA NECK FINDINGS Conventional branching of the great vessels from the arch. 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 Both common carotid arteries are widely patent. Both carotid bifurcations are free of disease. There is no cervical ICA  dissection or fibromuscular change. The RIGHT vertebral is the dominant/sole contributor to the basilar. I do not see the LEFT vertebral arising from the arch or LEFT subclavian. MRA HEAD FINDINGS The internal carotid arteries are widely patent. There is no proximal stenosis of the anterior or middle cerebral arteries. No intracranial branch occlusion is evident. The basilar artery is widely patent with RIGHT vertebral as the sole contributor. I do not see retrograde filling of the LEFT vertebral. There is Nadezhda Pollitt prominent LEFT AICA/PICA trunk which supplies the LEFT cerebellum. RIGHT AICA poorly seen. RIGHT PICA unremarkable. BILATERAL superior cerebellar arteries are patent, with some disease on the LEFT proximally. Posterior cerebral arteries widely patent. No saccular aneurysm. IMPRESSION: Widely patent BILATERAL internal carotid arteries and anterior circulation. RIGHT vertebral appears to be the dominant/ sole contributor to the basilar. See discussion above. Nonvisualization of LEFT vertebral. Otherwise no extracranial stenosis, dissection, or  occlusion. 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 Electronically Signed   By: Staci Righter M.D.   On: 07/12/2017 11:10   Mr Brain Wo Contrast  Result Date: 07/11/2017 CLINICAL DATA:  TIA.  Initial exam.  Slurred speech. EXAM: MRI HEAD WITHOUT CONTRAST TECHNIQUE: Multiplanar, multiecho pulse sequences of the brain and surrounding structures were obtained without intravenous contrast. COMPARISON:  CT head without contrast from the same day. FINDINGS: Brain: The lacunar infarct in the posterior limb of the right internal capsule is remote. No acute infarct, hemorrhage, or mass lesion is present. Mild periventricular and subcortical white matter changes bilaterally are slightly advanced for age. The ventricles are of normal size. The internal auditory canals are normal. The brainstem and cerebellum are within normal limits. No significant extra-axial fluid collection is present. Vascular: Flow is present in the major intracranial arteries. Skull and upper cervical spine: The skullbase is within normal limits. The craniocervical junction is normal. The upper cervical spine is normal. Marrow signal is within normal limits. Davian Wollenberg relatively empty sella is present. Midline sagittal structures are otherwise unremarkable. Sinuses/Orbits: The paranasal sinuses and mastoid air cells are clear. The globes and orbits are within normal limits. IMPRESSION: 1. Lacunar infarct in the posterior limb of the right internal capsule is remote. 2. No acute or focal intracranial abnormality to explain the patient's symptoms. 3. Mild atrophy and white matter disease is slightly advanced for age, likely reflecting the sequela of chronic microvascular ischemia. Electronically Signed   By: San Morelle M.D.   On: 07/11/2017 20:24   Mr Jodene Nam Head Wo Contrast  Result Date: 07/12/2017 CLINICAL DATA:  Transient ischemic attack with slurred speech. EXAM: MRA NECK WITHOUT AND WITH CONTRAST MRA HEAD WITHOUT CONTRAST TECHNIQUE:  Multiplanar and multiecho pulse sequences of the neck were obtained without and with intravenous contrast. Angiographic images of the neck were obtained using MRA technique without and with intravenous contrast.; Angiographic images of the Circle of Willis were obtained using MRA technique without intravenous contrast. CONTRAST:  33mL MULTIHANCE GADOBENATE DIMEGLUMINE 529 MG/ML IV SOLN COMPARISON:  MR brain 07/11/2017 did not show an acute stroke. FINDINGS: MRA NECK FINDINGS Conventional branching of the great vessels from the arch. 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 Both common carotid arteries are widely patent. Both carotid bifurcations are free of disease. There is no cervical ICA dissection or fibromuscular change. The RIGHT vertebral is the dominant/sole contributor to the basilar. I do not see the LEFT vertebral arising from the arch or LEFT subclavian. MRA HEAD FINDINGS The internal carotid arteries are widely patent. There is no proximal  stenosis of the anterior or middle cerebral arteries. No intracranial branch occlusion is evident. The basilar artery is widely patent with RIGHT vertebral as the sole contributor. I do not see retrograde filling of the LEFT vertebral. There is Jenniferann Stuckert prominent LEFT AICA/PICA trunk which supplies the LEFT cerebellum. RIGHT AICA poorly seen. RIGHT PICA unremarkable. BILATERAL superior cerebellar arteries are patent, with some disease on the LEFT proximally. Posterior cerebral arteries widely patent. No saccular aneurysm. IMPRESSION: Widely patent BILATERAL internal carotid arteries and anterior circulation. RIGHT vertebral appears to be the dominant/ sole contributor to the basilar. See discussion above. Nonvisualization of LEFT vertebral. Otherwise no extracranial stenosis, dissection, or occlusion. 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 Electronically Signed   By: Staci Righter M.D.   On: 07/12/2017 11:10   Ct Head Code Stroke Wo Contrast  Result Date:  07/11/2017 CLINICAL DATA:  Code stroke. Aphasia. Slurred speech and weakness beginning earlier today. EXAM: CT HEAD WITHOUT CONTRAST TECHNIQUE: Contiguous axial images were obtained from the base of the skull through the vertex without intravenous contrast. COMPARISON:  None. FINDINGS: Brain: Mild periventricular white matter hypoattenuation is within normal limits bilaterally. Zoei Amison lacunar infarct involving the posterior limb of the right internal capsule appears remote, relatively low density. No other focal lesions are present in the basal ganglia. Insular ribbon is normal bilaterally. The ventricles are of normal size. No significant extra-axial fluid collection is present. The brainstem and cerebellum are normal. Vascular: No hyperdense vessel or unexpected calcification. Skull: Calvarium is unremarkable. No focal lytic or blastic lesions are present. Sinuses/Orbits: The paranasal sinuses and mastoid air cells are clear. The globes and orbits are within normal limits. ASPECTS [facility] Stroke Program Early CT Score) - Ganglionic level infarction (caudate, lentiform nuclei, internal capsule, insula, M1-M3 cortex): 7/7 - Supraganglionic infarction (M4-M6 cortex): 3/3 Total score (0-10 with 10 being normal): 10/10 IMPRESSION: 1. Lacunar infarct of the posterior limb right internal capsule appears remote 2. Otherwise normal CT appearance of the brain for age. No acute intracranial abnormality. 3. ASPECTS is 10/10 These results were called by telephone at the time of interpretation on 07/11/2017 at 5:40 pm to Dr. Varney Biles , who verbally acknowledged these results. Electronically Signed   By: San Morelle M.D.   On: 07/11/2017 17:44        Scheduled Meds: . aspirin  300 mg Rectal Daily   Or  . aspirin  325 mg Oral Daily  . buPROPion  150 mg Oral Daily  . calcium carbonate  2 tablet Oral QHS  . diazepam  5 mg Oral QHS  . enoxaparin (LOVENOX) injection  40 mg Subcutaneous Q24H  . loratadine  10  mg Oral Daily  . metoprolol tartrate  25 mg Oral QHS  . pantoprazole  40 mg Oral Daily  . pravastatin  40 mg Oral q1800  . ramipril  5 mg Oral Daily   Continuous Infusions: . sodium chloride 50 mL/hr at 07/12/17 0306     LOS: 0 days    Time spent: 35 min    Lipa Knauff Melven Sartorius, MD [facility] [phone]   If 7PM-7AM, please contact night-coverage [email or web address] Password [facility] 07/12/2017, 8:58 PM

## 2017-07-12 NOTE — Progress Notes (Signed)
OT Evaluation  Clinical Impression: PTA pt independent in ADL and mobility. When OT entered, Pt shared that she has been ambulating to bathroom and back without assist or incident. When OT went to assess vision sitting EOB, Pt experiencing dizziness and returned to supine, limiting evaluation to bed level. BUE WFL, and Pt states that she is seeing an eye Dr who is working her up for glaucoma and cataracts "that may be what's causing my blurry vision" Pt continues to benefit from skilled OT in the acute setting and will require CIR level therapy to maximize safety and independence in ADL and functional transfers as well as continue to address vision.     07/12/17 1400  OT Visit Information  Last OT Received On 07/12/17  History of Present Illness Pt is a 71 y.o. female with PMH: CVA (39's), HTN, Anxiety who presented to hospital with slurred speech, c/o vertigo, blurred vision and difficulty with word finding. MRI did revealed remote Lacunar infarct in the posterior limb of the right internal capsule.  Precautions  Precautions Fall  Restrictions  Weight Bearing Restrictions No  Home Living  Family/patient expects to be discharged to: Private residence  Living Arrangements Alone  Available Help at Discharge Family;Friend(s);Available PRN/intermittently  Type of Home House  Home Access Stairs to enter  Entrance Stairs-Number of Steps 3  Entrance Stairs-Rails None  Home Layout One level  Bathroom Shower/Tub Tub/shower unit  Automotive engineer None  Prior Function  Level of Independence Independent  Communication  Communication No difficulties  Pain Assessment  Pain Assessment No/denies pain  Cognition  Arousal/Alertness Awake/alert  Behavior During Therapy WFL for tasks assessed/performed  Overall Cognitive Status Within Functional Limits for tasks assessed  Upper Extremity Assessment  Upper Extremity Assessment Overall WFL for tasks assessed  Lower Extremity  Assessment  Lower Extremity Assessment Overall WFL for tasks assessed  Cervical / Trunk Assessment  Cervical / Trunk Assessment Normal  ADL  Overall ADL's  Needs assistance/impaired  Eating/Feeding Modified independent  Eating/Feeding Details (indicate cue type and reason) Pt reports poor appetite  Grooming Set up;Bed level  Upper Body Bathing Set up;Bed level  Lower Body Bathing Set up;Bed level  Upper Body Dressing  Set up;Bed level  Lower Body Dressing Set up;Bed level  Toilet Transfer Minimal assistance  Toileting- Clothing Manipulation and Hygiene Minimal assistance  Functional mobility during ADLs (declined at this time)  General ADL Comments limited eval due to dizziness and vertigo symptoms at this time, will attempt tomorrow  Vision- History  Baseline Vision/History Glaucoma;Cataracts;Wears glasses (Pt is planning to see eye MD about vision)  Wears Glasses Reading only  Patient Visual Report Blurring of vision  Vision- Assessment  Vision Assessment? Vision impaired- to be further tested in functional context  Additional Comments needs to be tested, was going to perform EOB and then dizziness required return to supine  Bed Mobility  Overal bed mobility Needs Assistance  Bed Mobility Supine to Sit;Sit to Supine  Supine to sit Min guard  Sit to supine Min guard  General bed mobility comments Pt initiating vertigo symptoms upon sitting EOB - no nystagmus noted, Pt returned to supine at this time  Transfers  General transfer comment Pt declined transfer at this time  Balance  Overall balance assessment Needs assistance  Sitting-balance support Feet supported  Sitting balance-Leahy Scale Fair  Sitting balance - Comments sitting EOB very briefly  General Comments  General comments (skin integrity, edema, etc.) Pt declined OOB at  this time due to dizziness  OT - End of Session  Activity Tolerance Patient tolerated treatment well;Other (comment) (limited by dizziness)   Patient left in bed;with call bell/phone within reach;with family/visitor present  Nurse Communication Other (comment);Mobility status (dizziness during session)  OT Assessment  OT Recommendation/Assessment Patient needs continued OT Services  OT Visit Diagnosis Dizziness and giddiness (R42)  OT Problem List Decreased safety awareness;Impaired balance (sitting and/or standing);Impaired vision/perception  Barriers to Discharge Decreased caregiver support  Barriers to Discharge Comments Has good support from local daughter, but lives alone typically  OT Plan  OT Frequency (ACUTE ONLY) Min 3X/week  OT Treatment/Interventions (ACUTE ONLY) Self-care/ADL training;DME and/or AE instruction;Therapeutic activities;Visual/perceptual remediation/compensation;Patient/family education;Balance training  AM-PAC OT "6 Clicks" Daily Activity Outcome Measure  Help from another person eating meals? 4  Help from another person taking care of personal grooming? 3  Help from another person toileting, which includes using toliet, bedpan, or urinal? 3  Help from another person bathing (including washing, rinsing, drying)? 3  Help from another person to put on and taking off regular upper body clothing? 3  Help from another person to put on and taking off regular lower body clothing? 3  6 Click Score 19  ADL G Code Conversion CK  OT Recommendation  Recommendations for Other Services Rehab consult  Follow Up Recommendations CIR;Supervision/Assistance - 24 hour  OT Equipment 3 in 1 bedside commode  Individuals Consulted  Consulted and Agree with Results and Recommendations Patient  Acute Rehab OT Goals  Patient Stated Goal to get home  OT Goal Formulation With patient  Time For Goal Achievement 07/26/17  Potential to Achieve Goals Good  OT Time Calculation  OT Start Time (ACUTE ONLY) 1435  OT Stop Time (ACUTE ONLY) 1456  OT Time Calculation (min) 21 min  OT G-codes **NOT FOR INPATIENT CLASS**  Functional  Assessment Tool Used AM-PAC 6 Clicks Daily Activity  Functional Limitation Self care  Self Care Current Status (Y7741) CK  Self Care Goal Status (O8786) CI  OT General Charges  $OT Visit 1 Procedure  OT Evaluation  $OT Eval Moderate Complexity 1 Procedure  Written Expression  Dominant Hand Right

## 2017-07-12 NOTE — Progress Notes (Signed)
STROKE TEAM PROGRESS NOTE   HISTORY OF PRESENT ILLNESS (per record) Debra Roth is a 71 y.o. female who presented with slurred speech x 20 hours and CP for one week. She saw her PCP who advised her to be emergently evaluated. She also complained of vertigo, blurred vision and difficulty with word finding. Denied limb weakness or dysphagia. On arrival to the ED, her initial BP was 210/100, which decreased to 160/99. After one hour in the ED, her symptoms resolved.   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 Her PMHx also includes anxiety and HTN.   She takes pravastatin 20 mg po qd. She does not take ASA regularly.     SUBJECTIVE (INTERVAL HISTORY) No family is at the bedside.  She has no complaints today   OBJECTIVE Temp:  [97.6 F (36.4 C)-98.5 F (36.9 C)] 97.8 F (36.6 C) (08/25 0830) Pulse Rate:  [52-65] 60 (08/25 0830) Cardiac Rhythm: Sinus bradycardia;Heart block (08/25 0742) Resp:  [13-19] 16 (08/25 0830) BP: (118-191)/(55-97) 181/77 (08/25 0830) SpO2:  [91 %-100 %] 99 % (08/25 0830) Weight:  [143 lb (64.9 kg)-150 lb 2.1 oz (68.1 kg)] 150 lb 2.1 oz (68.1 kg) (08/25 0030)  CBC:   Recent Labs Lab 07/11/17 1722 07/12/17 0429  WBC 5.5 4.5  NEUTROABS 3.5  --   HGB 13.6 13.2  HCT 40.2 39.6  MCV 94.1 93.8  PLT 142* 136*    Basic Metabolic Panel:   Recent Labs Lab 07/11/17 1722 07/12/17 0429  NA 137 139  K 4.1 3.6  CL 103 104  CO2 26 27  GLUCOSE 81 91  BUN 26* 20  CREATININE 0.94 0.90  CALCIUM 8.8* 8.7*    Lipid Panel:     Component Value Date/Time   CHOL 173 07/12/2017 0429   TRIG 112 07/12/2017 0429   HDL 65 07/12/2017 0429   CHOLHDL 2.7 07/12/2017 0429   VLDL 22 07/12/2017 0429   LDLCALC 86 07/12/2017 0429   HgbA1c:  Lab Results  Component Value Date   HGBA1C 4.9 07/12/2017   Urine Drug Screen:      Component Value Date/Time   LABOPIA NONE DETECTED 07/12/2017 0825   COCAINSCRNUR NONE DETECTED 07/12/2017 0825   LABBENZ POSITIVE (A) 07/12/2017 0825   AMPHETMU NONE DETECTED 07/12/2017 0825   THCU NONE DETECTED 07/12/2017 0825   LABBARB NONE DETECTED 07/12/2017 0825    Alcohol Level No results found for: [facility]  Imaging   Mr Brain Wo Contrast 07/11/2017 1. Lacunar infarct in the posterior limb of the right internal capsule is remote.  2. No acute or focal intracranial abnormality to explain the patient's symptoms.  3. Mild atrophy and white matter disease is slightly advanced for age, likely reflecting the sequela of chronic microvascular ischemia.    Ct Head Code Stroke Wo Contrast 07/11/2017 1. Lacunar infarct of the posterior limb right internal capsule appears remote  2. Otherwise normal CT appearance of the brain for age. No acute intracranial abnormality.  3. ASPECTS is 10/10     Transthoracic Echocardiogram - pending     PHYSICAL EXAM  Vitals:   07/12/17 0222 07/12/17 0430 07/12/17 0630 07/12/17 0830  BP: 139/62 (!) 134/55 118/64 (!) 181/77  Pulse: (!) 52 (!) 52 (!) 54 60  Resp: 16 16 16 16   Temp:  97.8 F (36.6  C) 97.6 F (36.4 C) 97.8 F (36.6 C)  TempSrc: Oral Oral Oral Oral  SpO2: 95% 100% 99% 99%  Weight:      Height:       Pleasant elderly lady currently not in distress. . She appears anxious and is emotionally labile Afebrile. Head is nontraumatic. Neck is supple without bruit.    Cardiac exam no murmur or gallop. Lungs are clear to auscultation. Distal pulses are well felt. Neurological Exam ;  Awake  Alert oriented x 3. Normal speech and language.eye movements full without nystagmus.fundi were not visualized. Vision acuity and fields appear normal. Hearing is normal. Palatal movements are normal. Face symmetric. Tongue midline. Normal strength, tone, reflexes and coordination. Normal sensation. Gait deferred.    ASSESSMENT/PLAN Ms. Debra Roth is a 71 y.o. female with history of a previous stroke, hypertension, anxiety, and hyperlipidemia presenting with chest pain, slurred speech, vertigo, blurred vision, word finding difficulties and elevated blood pressure. She did not receive IV t-PA due to late presentation and resolution of deficits.  Possible TIA:    Resultant - resolution of deficits  CT head - Lacunar infarct of the posterior limb right internal capsule appears remote   MRI head - No acute findings. Remote right internal capsule lacunar infarct.  MRA head - pending  MRA neck - pending  Carotid Doppler - canceled - MRA neck  2D Echo - pending  LDL - 86  HgbA1c - 4.9  VTE prophylaxis - Lovenox Diet Heart Room service appropriate? Yes; Fluid consistency: Thin  No antithrombotic prior to admission, now on aspirin 325 mg daily  Patient counseled to be compliant with her antithrombotic medications  Ongoing aggressive stroke risk factor management  Therapy recommendations:  pending  Disposition: Pending     Hypertension  Stable  Permissive hypertension (OK if < 220/120) but gradually normalize in 5-7 days  Long-term BP goal normotensive  Hyperlipidemia  Home meds:  Pravachol 20 mg daily resumed in hospital  LDL 86, goal < 70  Pravachol increased to 40 mg daily  Continue statin at discharge    Other Stroke Risk Factors  Advanced age  ETOH use, advised to drink no more than 1 drink per day  Overweight, Body mass index is 27.46 kg/m., recommend weight loss, diet and exercise as appropriate   Hx stroke/TIA   Other Active Problems  Mild thrombocytopenia  Hospital day # 0  I have personally examined this patient, reviewed notes, independently viewed imaging studies, participated in medical decision making and plan of care.ROS completed by me personally and pertinent positives fully documented  I have made any additions or clarifications directly to the above note. . She presented  with transient speech and expressive language difficulties possibly a left hemispheric TIA etiology to be determined. Recommend take aspirin daily as well as increased dose of Pravachol to 40 mg daily. Check ongoing stroke evaluation. Greater than 50% time during this 35 minute visit were spent on counseling and coordination of care about stroke and TIA risk, evaluation, prevention and treatment discussion and answering questions. Discussed with Dr. Florene Glen.  Antony Contras, MD Medical Director [facility] Stroke Center Pager: [phone] 07/12/2017 1:01 PM   To contact Stroke Continuity provider, please refer to [email or web address] After hours, contact General Neurology

## 2017-07-12 NOTE — Discharge Summary (Signed)
Physician Discharge Summary  Debra Roth YFV:[id number] DOB: 20-Aug-1946 DOA: 07/11/2017  PCP: Bartholome Bill, MD  Admit date: 07/11/2017 Discharge date: 07/13/2017  Time spent: greater than 30 minutes  Recommendations for Outpatient Follow-up:  1. Follow up with outpatient PT for vestibular rehab 2. Follow up with outpatient neurology  3. Follow up bradycardia, low suspicion this has contributed to sx  4. Continue to follow outpatient BP's and adjust medications prn 5. Follow up CBC for thrombocytopenia  Discharge Diagnoses:  Principal Problem:   TIA (transient ischemic attack) Active Problems:   Essential hypertension   Hyperlipidemia   Blepharospasm   Bradycardia   Vestibular dysfunction   Discharge Condition: stable  Diet recommendation: heart healthy  Filed Weights   07/11/17 1621 07/12/17 0030  Weight: 64.9 kg (143 lb) 68.1 kg (150 lb 2.1 oz)    History of present illness:  Debra Roth Debra Roth 71 y.o.femalewith history of previous stroke in the 90s which affected her left side, hypertension, blepharospasm and anxiety was referred to the ER by patient's PCP after patient was found to have difficulty speaking. Patient states over the last 1 week patient has been having increasing dizziness with spinning sensation. Patient also have some blurred vision for which patient had followed up with her ophthalmologist 3 days ago and was told that she may have had possible glaucoma. Patient's primary care was planned to get MRI done as outpatient for her symptoms but yesterday around 2 PM patient started developing difficulty bringing out words. Denies any weakness of the upper or lower extremities or any difficulty swallowing. She was referred to the ER by her PCP.    Her sx resolved by 1 hour, she was admitted for concern for TIA.  She had CT head which showed remote lacunar infart of right internal capsule.  MRI showed similar findings as well as mild atrophy and white  matter disease which was advanced for age, "likely reflecting" chronic microvascular ischemia.  MRI neck showed patent internal carotid arteries and anterior circulation, the L vertebral artery was not visualized (R vertebral artery thought sole/dominant contributor to basilar).  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  Echo showed normal EF, grade 1 diastolic dysfunction.  Telemetry showed sinus brady, first degree AV block.  She was started on ASA 325 and her pravastatin was increased to 40 mg daily per neurology.  She was seen by PT who found her positive for VOR head thrust indicating L side hypofunction and recommended vestibular rehab.  Follow up with neurology for TIA.   Hospital Course:  TIA    - PT/OT/Speech -> PT initially recommended CIR, but on reeval today only recommending outpatient vestibular rehab - nl A1c and lipids - ASA, increase pravastatin to 40 per neuro - echo with normal EF, diastolic dysfunction.  - MRA head/neck with patent ICA, nonvisualization of L vertebral.  75% stenosis of subclavian artery (on discussion with neuro, unlikely related. She does not have any sx when using that arm).   Postive UA with small LE and WBC: likely asx bacteruria, no sx.  [x]  f/u cx, negative   Hypertension - Patient states her blood pressure has been running high recently. Patient is on ramipril and metoprolol. Closely follow blood pressure trends.  Hyperlipidemia pravasatin, increase to 40  Blepharospasm - gets Botox injections.  History of anxiety and Valium and Wellbutrin.  Previous history of stroke.  Mild  thrombocytopenia, ctm   Procedures:  Echo with normal EF, diastolic dysfunction  Consultations:  neurology  Discharge Exam: Vitals:   07/13/17 0959 07/13/17 1707  BP: (!) 154/67 (!) 153/61  Pulse: 63 64  Resp: 18 18  Temp: 97.9 F (36.6 C) 97.8 F (36.6 C)  SpO2:  98% 99%   She's been up without any gait problems today.  Doing ok.  Thinks yesterday she had some trouble because it was the first time since Friday she'd gotten up to walk.   General: No acute distress. Cardiovascular: Heart sounds show Debra Roth regular rate, and rhythm. No gallops or rubs. No murmurs. No JVD. Lungs: Clear to auscultation bilaterally with good air movement. No rales, rhonchi or wheezes. Abdomen: Soft, nontender, nondistended with normal active bowel sounds. No masses. No hepatosplenomegaly. Neurological: Alert and oriented 3. Moves all extremities 4 with equal strength. CN 2-12 intact.  No focal deficits.   Skin: Warm and dry. No rashes or lesions. Extremities: No clubbing or cyanosis. No edema. Pedal pulses 2+. Psychiatric: anxious  Discharge Instructions   Discharge Instructions    Ambulatory referral to Neurology    Complete by:  As directed    An appointment is requested in approximately: 4 weeks   Call MD for:  difficulty breathing, headache or visual disturbances    Complete by:  As directed    Call MD for:  extreme fatigue    Complete by:  As directed    Call MD for:  hives    Complete by:  As directed    Call MD for:  persistant dizziness or light-headedness    Complete by:  As directed    Call MD for:  persistant nausea and vomiting    Complete by:  As directed    Call MD for:  severe uncontrolled pain    Complete by:  As directed    Call MD for:  temperature >100.4    Complete by:  As directed    Diet - low sodium heart healthy    Complete by:  As directed    Discharge instructions    Complete by:  As directed    You were seen in the hospital for concern for Debra Roth TIA. Your workup was not revealing for Debra Roth cause.  Please follow up with your primary care provider in the next week or so.  Start the daily aspirin and increase your pravastatin to 40 mg daily.  You were also found to have some inner ear findings that can cause dizziness.  We'll provide you with Debra Roth  referral to see physical therapy for vestibular rehab.  Return if you have new or recurrent symptoms.   Increase activity slowly    Complete by:  As directed      Current Discharge Medication List    START taking these medications   Details  aspirin 325 MG tablet Take 1 tablet (325 mg total) by mouth daily. Qty: 30 tablet, Refills: 0      CONTINUE these medications which have CHANGED   Details  pravastatin (PRAVACHOL) 40 MG tablet Take 1 tablet (40 mg total) by mouth daily at 6 PM. Qty: 30 tablet, Refills: 0      CONTINUE these medications which have NOT CHANGED   Details  botulinum toxin Type Debra Roth (BOTOX) 100 units SOLR injection Inject 33.5 Units as directed every 3 (three) months.    buPROPion (WELLBUTRIN XL) 150 MG 24 hr tablet TAKE 1 TABLET (150 MG TOTAL) BY MOUTH DAILY. Qty:  30 tablet, Refills: 0   Associated Diagnoses: Adjustment disorder with mixed anxiety and depressed mood    calcium carbonate (TUMS - DOSED IN MG ELEMENTAL CALCIUM) 500 MG chewable tablet Chew 2 tablets by mouth at bedtime.     cetirizine (ZYRTEC) 10 MG tablet Take 10 mg by mouth daily as needed for allergies.    diazepam (VALIUM) 5 MG tablet Take one at night and one additional in daytime if needed for anxiety and panic Qty: 60 tablet, Refills: 5   Associated Diagnoses: Adjustment disorder with mixed anxiety and depressed mood; Sleep disturbance    loperamide (IMODIUM) 2 MG capsule Take 10 mg by mouth daily as needed for diarrhea or loose stools.     metoprolol tartrate (LOPRESSOR) 25 MG tablet TAKE 1 TABLET (25 MG TOTAL) BY MOUTH DAILY. Qty: 90 tablet, Refills: 0   Associated Diagnoses: Essential hypertension    Multiple Vitamins-Minerals (DRY EYE FORMULA PO) Place 2 drops into both eyes as needed.    omeprazole (PRILOSEC) 20 MG capsule Take 20 mg by mouth as needed.     ramipril (ALTACE) 5 MG capsule TAKE 1 CAPSULE (5 MG TOTAL) BY MOUTH DAILY. Qty: 90 capsule, Refills: 0   Associated Diagnoses:  Essential hypertension       Allergies  Allergen Reactions  . Pollen Extract Other (See Comments)    Seasonal allergies      The results of significant diagnostics from this hospitalization (including imaging, microbiology, ancillary and laboratory) are listed below for reference.    Significant Diagnostic Studies: Mr Debra Roth Neck W Wo Contrast  Result Date: 07/12/2017 CLINICAL DATA:  Transient ischemic attack with slurred speech. EXAM: MRA NECK WITHOUT AND WITH CONTRAST MRA HEAD WITHOUT CONTRAST TECHNIQUE: Multiplanar and multiecho pulse sequences of the neck were obtained without and with intravenous contrast. Angiographic images of the neck were obtained using MRA technique without and with intravenous contrast.; Angiographic images of the Circle of Willis were obtained using MRA technique without intravenous contrast. CONTRAST:  26mL MULTIHANCE GADOBENATE DIMEGLUMINE 529 MG/ML IV SOLN COMPARISON:  MR brain 07/11/2017 did not show an acute stroke. FINDINGS: MRA NECK FINDINGS Conventional branching of the great vessels from the arch. 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 Both common carotid arteries are widely patent. Both carotid bifurcations are free of disease. There is no cervical ICA dissection or fibromuscular change. The RIGHT vertebral is the dominant/sole contributor to the basilar. I do not see the LEFT vertebral arising from the arch or LEFT subclavian. MRA HEAD FINDINGS The internal carotid arteries are widely patent. There is no proximal stenosis of the anterior or middle cerebral arteries. No intracranial branch occlusion is evident. The basilar artery is widely patent with RIGHT vertebral as the sole contributor. I do not see retrograde filling of the LEFT vertebral. There is Jack Bolio prominent LEFT AICA/PICA trunk which supplies the LEFT cerebellum. RIGHT AICA poorly seen. RIGHT PICA unremarkable. BILATERAL superior cerebellar arteries are patent, with some disease on the LEFT  proximally. Posterior cerebral arteries widely patent. No saccular aneurysm. IMPRESSION: Widely patent BILATERAL internal carotid arteries and anterior circulation. RIGHT vertebral appears to be the dominant/ sole contributor to the basilar. See discussion above. Nonvisualization of LEFT vertebral. Otherwise no extracranial stenosis, dissection, or occlusion. 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 Electronically Signed   By: Staci Righter M.D.   On: 07/12/2017 11:10   Mr Brain Wo Contrast  Result Date: 07/11/2017 CLINICAL DATA:  TIA.  Initial exam.  Slurred speech. EXAM: MRI HEAD  WITHOUT CONTRAST TECHNIQUE: Multiplanar, multiecho pulse sequences of the brain and surrounding structures were obtained without intravenous contrast. COMPARISON:  CT head without contrast from the same day. FINDINGS: Brain: The lacunar infarct in the posterior limb of the right internal capsule is remote. No acute infarct, hemorrhage, or mass lesion is present. Mild periventricular and subcortical white matter changes bilaterally are slightly advanced for age. The ventricles are of normal size. The internal auditory canals are normal. The brainstem and cerebellum are within normal limits. No significant extra-axial fluid collection is present. Vascular: Flow is present in the major intracranial arteries. Skull and upper cervical spine: The skullbase is within normal limits. The craniocervical junction is normal. The upper cervical spine is normal. Marrow signal is within normal limits. Debra Roth relatively empty sella is present. Midline sagittal structures are otherwise unremarkable. Sinuses/Orbits: The paranasal sinuses and mastoid air cells are clear. The globes and orbits are within normal limits. IMPRESSION: 1. Lacunar infarct in the posterior limb of the right internal capsule is remote. 2. No acute or focal intracranial abnormality to explain the patient's symptoms. 3. Mild atrophy and white matter disease is slightly advanced  for age, likely reflecting the sequela of chronic microvascular ischemia. Electronically Signed   By: San Morelle M.D.   On: 07/11/2017 20:24   Mr Debra Roth Head Wo Contrast  Result Date: 07/12/2017 CLINICAL DATA:  Transient ischemic attack with slurred speech. EXAM: MRA NECK WITHOUT AND WITH CONTRAST MRA HEAD WITHOUT CONTRAST TECHNIQUE: Multiplanar and multiecho pulse sequences of the neck were obtained without and with intravenous contrast. Angiographic images of the neck were obtained using MRA technique without and with intravenous contrast.; Angiographic images of the Circle of Willis were obtained using MRA technique without intravenous contrast. CONTRAST:  73mL MULTIHANCE GADOBENATE DIMEGLUMINE 529 MG/ML IV SOLN COMPARISON:  MR brain 07/11/2017 did not show an acute stroke. FINDINGS: MRA NECK FINDINGS Conventional branching of the great vessels from the arch. 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 Both common carotid arteries are widely patent. Both carotid bifurcations are free of disease. There is no cervical ICA dissection or fibromuscular change. The RIGHT vertebral is the dominant/sole contributor to the basilar. I do not see the LEFT vertebral arising from the arch or LEFT subclavian. MRA HEAD FINDINGS The internal carotid arteries are widely patent. There is no proximal stenosis of the anterior or middle cerebral arteries. No intracranial branch occlusion is evident. The basilar artery is widely patent with RIGHT vertebral as the sole contributor. I do not see retrograde filling of the LEFT vertebral. There is Debra Roth prominent LEFT AICA/PICA trunk which supplies the LEFT cerebellum. RIGHT AICA poorly seen. RIGHT PICA unremarkable. BILATERAL superior cerebellar arteries are patent, with some disease on the LEFT proximally. Posterior cerebral arteries widely patent. No saccular aneurysm. IMPRESSION: Widely patent BILATERAL internal carotid arteries and anterior circulation. RIGHT vertebral  appears to be the dominant/ sole contributor to the basilar. See discussion above. Nonvisualization of LEFT vertebral. Otherwise no extracranial stenosis, dissection, or occlusion. 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 Electronically Signed   By: Staci Righter M.D.   On: 07/12/2017 11:10   Ct Head Code Stroke Wo Contrast  Result Date: 07/11/2017 CLINICAL DATA:  Code stroke. Aphasia. Slurred speech and weakness beginning earlier today. EXAM: CT HEAD WITHOUT CONTRAST TECHNIQUE: Contiguous axial images were obtained from the base of the skull through the vertex without intravenous contrast. COMPARISON:  None. FINDINGS: Brain: Mild periventricular white matter hypoattenuation is within normal limits bilaterally. Debra Roth lacunar  infarct involving the posterior limb of the right internal capsule appears remote, relatively low density. No other focal lesions are present in the basal ganglia. Insular ribbon is normal bilaterally. The ventricles are of normal size. No significant extra-axial fluid collection is present. The brainstem and cerebellum are normal. Vascular: No hyperdense vessel or unexpected calcification. Skull: Calvarium is unremarkable. No focal lytic or blastic lesions are present. Sinuses/Orbits: The paranasal sinuses and mastoid air cells are clear. The globes and orbits are within normal limits. ASPECTS [facility] Stroke Program Early CT Score) - Ganglionic level infarction (caudate, lentiform nuclei, internal capsule, insula, M1-M3 cortex): 7/7 - Supraganglionic infarction (M4-M6 cortex): 3/3 Total score (0-10 with 10 being normal): 10/10 IMPRESSION: 1. Lacunar infarct of the posterior limb right internal capsule appears remote 2. Otherwise normal CT appearance of the brain for age. No acute intracranial abnormality. 3. ASPECTS is 10/10 These results were called by telephone at the time of interpretation on 07/11/2017 at 5:40 pm to Dr. Varney Biles , who verbally acknowledged these results.  Electronically Signed   By: San Morelle M.D.   On: 07/11/2017 17:44    Microbiology: Recent Results (from the past 240 hour(s))  Culture, Urine     Status: None   Collection Time: 07/12/17 12:10 PM  Result Value Ref Range Status   Specimen Description URINE, RANDOM  Final   Special Requests NONE  Final   Culture NO GROWTH  Final   Report Status 07/13/2017 FINAL  Final     Labs: Basic Metabolic Panel:  Recent Labs Lab 07/11/17 1722 07/12/17 0429  NA 137 139  K 4.1 3.6  CL 103 104  CO2 26 27  GLUCOSE 81 91  BUN 26* 20  CREATININE 0.94 0.90  CALCIUM 8.8* 8.7*   Liver Function Tests:  Recent Labs Lab 07/11/17 1722 07/12/17 0429  AST 22 21  ALT 19 18  ALKPHOS 51 49  BILITOT 0.8 0.5  PROT 7.0 6.4*  ALBUMIN 3.8 3.5   No results for input(s): LIPASE, AMYLASE in the last 168 hours. No results for input(s): AMMONIA in the last 168 hours. CBC:  Recent Labs Lab 07/11/17 1722 07/12/17 0429  WBC 5.5 4.5  NEUTROABS 3.5  --   HGB 13.6 13.2  HCT 40.2 39.6  MCV 94.1 93.8  PLT 142* 136*   Cardiac Enzymes: No results for input(s): CKTOTAL, CKMB, CKMBINDEX, TROPONINI in the last 168 hours. BNP: BNP (last 3 results) No results for input(s): BNP in the last 8760 hours.  ProBNP (last 3 results) No results for input(s): PROBNP in the last 8760 hours.  CBG: No results for input(s): GLUCAP in the last 168 hours.     Signed:  Shreshta Roth Melven Sartorius MD.  [facility] 07/13/2017, 7:34 PM

## 2017-07-13 ENCOUNTER — Inpatient Hospital Stay (HOSPITAL_COMMUNITY): Payer: Medicare Other

## 2017-07-13 DIAGNOSIS — R001 Bradycardia, unspecified: Secondary | ICD-10-CM

## 2017-07-13 DIAGNOSIS — H832X9 Labyrinthine dysfunction, unspecified ear: Secondary | ICD-10-CM

## 2017-07-13 DIAGNOSIS — I5030 Unspecified diastolic (congestive) heart failure: Secondary | ICD-10-CM

## 2017-07-13 LAB — URINE CULTURE: Culture: NO GROWTH

## 2017-07-13 LAB — ECHOCARDIOGRAM COMPLETE
Height: 62 in
WEIGHTICAEL: 2402.13 [oz_av]

## 2017-07-13 MED ORDER — PRAVASTATIN SODIUM 40 MG PO TABS: 40.0000 mg | ORAL_TABLET | Freq: Every day | ORAL | 0 refills | Status: DC

## 2017-07-13 MED ORDER — ASPIRIN 325 MG PO TABS: 325.0000 mg | ORAL_TABLET | Freq: Every day | ORAL | 0 refills | Status: DC

## 2017-07-13 NOTE — Progress Notes (Signed)
*  PRELIMINARY RESULTS* Echocardiogram 2D Echocardiogram has been performed.  Debra Roth 07/13/2017, 3:06 PM

## 2017-07-13 NOTE — Progress Notes (Signed)
Physical Therapy Progress Note  Patient instructed in x1 exercises for hypofunction.  Patient able to demonstrate understanding.   Patient continues to be very anxious - could be increasing her symptoms.    07/13/17 1851  PT Visit Information  Last PT Received On 07/13/17  Assistance Needed +1  History of Present Illness Pt is a 71 y.o. female with PMH: CVA (22's), HTN, Anxiety who presented to hospital with slurred speech, c/o vertigo, blurred vision and difficulty with word finding. MRI did revealed remote Lacunar infarct in the posterior limb of the right internal capsule.  Subjective Data  Patient Stated Goal to go home  Precautions  Precautions None  Restrictions  Weight Bearing Restrictions No  Pain Assessment  Pain Assessment No/denies pain  Cognition  Arousal/Alertness Awake/alert  Behavior During Therapy [facility] for tasks assessed/performed;Anxious  Overall Cognitive Status Within Functional Limits for tasks assessed  Modified Rankin (Stroke Patients Only)  Pre-Morbid Rankin Score 0  Modified Rankin 2  Exercises  Exercises Other exercises  Other Exercises  Other Exercises X1 Vestibular exercises.  Horizontally 1 min x3, and Vertically 1 minute x3.  Patient to perform 2x/day.  PT - End of Session  Activity Tolerance Patient tolerated treatment well  Patient left in bed;with call bell/phone within reach  PT - Assessment/Plan  PT Plan Current plan remains appropriate  PT Visit Diagnosis Unsteadiness on feet (R26.81);Dizziness and giddiness (R42)  PT Frequency (ACUTE ONLY) Min 3X/week  Follow Up Recommendations Outpatient PT (for Vestibular Rehab)  PT equipment None recommended by PT  AM-PAC PT "6 Clicks" Daily Activity Outcome Measure  Difficulty turning over in bed (including adjusting bedclothes, sheets and blankets)? 4  Difficulty moving from lying on back to sitting on the side of the bed?  4  Difficulty sitting down on and standing up from a chair with arms (e.g.,  wheelchair, bedside commode, etc,.)? 4  Help needed moving to and from a bed to chair (including a wheelchair)? 4  Help needed walking in hospital room? 4  Help needed climbing 3-5 steps with a railing?  3  6 Click Score 23  Mobility G Code  CI  PT Goal Progression  Progress towards PT goals Goals met/education completed, patient discharged from PT  PT Time Calculation  PT Start Time (ACUTE ONLY) 1705  PT Stop Time (ACUTE ONLY) 1720  PT Time Calculation (min) (ACUTE ONLY) 15 min  PT General Charges  $$ ACUTE PT VISIT 1 Visit  PT Treatments  $Therapeutic Exercise 8-22 mins  [name]. Sanjuana Kava, [place] Pager [phone]

## 2017-07-13 NOTE — Progress Notes (Signed)
Physical Therapy Vestibular Assessment Data    07/13/17 1832  Vestibular Assessment  General Observation Patient reports that she has had vertigo off and on for 2 years.  Takes dramamine for dizziness.   Patient reports having significant HTN during some of these episodes.  Patient also reports she has significant anxiety that impacts her daily life (doesn't go to crowded places, minimizes driving).  Also reports that she has ongoing "sinus trouble".  Symptom Behavior  Type of Dizziness Spinning (Imbalance)  Frequency of Dizziness Throughout day now for 2 weeks  Duration of Dizziness "Not all day, but breaks during day"  Aggravating Factors Turning head quickly;Walking in a crowd;Supine to sit  Relieving Factors Lying supine;Rest (Dramimine)  Occulomotor Exam  Occulomotor Alignment Normal  Spontaneous Absent  Gaze-induced Absent  Head shaking Horizontal Absent  Head Shaking Vertical Absent  Smooth Pursuits Intact  Saccades Intact  Vestibulo-Occular Reflex  VOR 1 Head Only (x 1 viewing) Head thrust positive-Lt hypofunction  Positional Testing  Dix-Hallpike Dix-Hallpike Right;Dix-Hallpike Left  Horizontal Canal Testing Horizontal Canal Right;Horizontal Canal Left  Dix-Hallpike Right  Dix-Hallpike Right Duration 0  Dix-Hallpike Right Symptoms No nystagmus  Dix-Hallpike Left  Dix-Hallpike Left Duration 0  Dix-Hallpike Left Symptoms No nystagmus  Horizontal Canal Right  Horizontal Canal Right Duration 0  Horizontal Canal Right Symptoms Normal  Horizontal Canal Left  Horizontal Canal Left Duration 0  Horizontal Canal Left Symptoms Normal  Cognition  Cognition Orientation Level Oriented x 4  Cognition Comment Anxiety.  Anxious during session.  [name] Sanjuana Kava, [place] Pager [phone]

## 2017-07-13 NOTE — Progress Notes (Signed)
STROKE TEAM PROGRESS NOTE   HISTORY OF PRESENT ILLNESS (per record) Debra Roth is a 71 y.o. female who presented with slurred speech x 20 hours and CP for one week. She saw her PCP who advised her to be emergently evaluated. She also complained of vertigo, blurred vision and difficulty with word finding. Denied limb weakness or dysphagia. On arrival to the ED, her initial BP was 210/100, which decreased to 160/99. After one hour in the ED, her symptoms resolved.   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 Her PMHx also includes anxiety and HTN.   She takes pravastatin 20 mg po qd. She does not take ASA regularly.     SUBJECTIVE (INTERVAL HISTORY) No family is at the bedside.  She has no complaints today,she wants to go home but echocardiogram is pending   OBJECTIVE Temp:  [97.7 F (36.5 C)-98.6 F (37 C)] 97.9 F (36.6 C) (08/26 0959) Pulse Rate:  [58-63] 63 (08/26 0959) Cardiac Rhythm: Normal sinus rhythm;Heart block (08/26 0833) Resp:  [16-18] 18 (08/26 0959) BP: (107-156)/(51-83) 154/67 (08/26 0959) SpO2:  [97 %-99 %] 98 % (08/26 0959)  CBC:   Recent Labs Lab 07/11/17 1722 07/12/17 0429  WBC 5.5 4.5  NEUTROABS 3.5  --   HGB 13.6 13.2  HCT 40.2 39.6  MCV 94.1 93.8  PLT 142* 136*    Basic Metabolic Panel:   Recent Labs Lab 07/11/17 1722 07/12/17 0429  NA 137 139  K 4.1 3.6  CL 103 104  CO2 26 27  GLUCOSE 81 91  BUN 26* 20  CREATININE 0.94 0.90  CALCIUM 8.8* 8.7*    Lipid Panel:     Component Value Date/Time   CHOL 173 07/12/2017 0429   TRIG 112 07/12/2017 0429   HDL 65 07/12/2017 0429   CHOLHDL 2.7 07/12/2017 0429   VLDL 22 07/12/2017 0429   LDLCALC 86 07/12/2017 0429   HgbA1c:  Lab Results  Component Value Date   HGBA1C 4.9 07/12/2017   Urine Drug Screen:     Component Value Date/Time   LABOPIA NONE DETECTED 07/12/2017 0825   COCAINSCRNUR NONE DETECTED 07/12/2017 0825   LABBENZ POSITIVE (A) 07/12/2017 0825   AMPHETMU NONE DETECTED 07/12/2017 0825   THCU NONE DETECTED 07/12/2017 0825   LABBARB NONE DETECTED 07/12/2017 0825    Alcohol Level No results found for: [facility]  Imaging   Mr Brain Wo Contrast 07/11/2017 1. Lacunar infarct in the posterior limb of the right internal capsule is remote.  2. No acute or focal intracranial abnormality to explain the patient's symptoms.  3. Mild atrophy and white matter disease is slightly advanced for age, likely reflecting the sequela of chronic microvascular ischemia.    Ct Head Code Stroke Wo Contrast 07/11/2017 1. Lacunar infarct of the posterior limb right internal capsule appears remote  2. Otherwise normal CT appearance of the brain for age. No acute intracranial abnormality.  3. ASPECTS is 10/10     Transthoracic Echocardiogram - pending     PHYSICAL EXAM  Vitals:   07/12/17 2120 07/13/17 0100 07/13/17 0553 07/13/17 0959  BP: (!) 154/72 (!) 107/51 127/71 (!) 154/67  Pulse: 63 (!) 58 60 63  Resp: 18 18 16 18   Temp: 98.5 F (36.9 C) 97.7 F (36.5 C) 98.1 F (36.7 C) 97.9 F (36.6 C)  TempSrc: Oral Oral Oral Oral  SpO2: 97% 99% 98% 98%  Weight:      Height:       Pleasant elderly lady currently not in distress. . She appears anxious and is emotionally labile Afebrile. Head is nontraumatic. Neck is supple without bruit.    Cardiac exam no murmur or gallop. Lungs are clear to auscultation. Distal pulses are well felt. Neurological Exam ;  Awake  Alert oriented x 3. Normal speech and language.eye movements full without nystagmus.fundi were not visualized. Vision acuity and fields appear normal. Hearing is normal. Palatal movements are normal. Face symmetric. Tongue midline. Normal strength, tone, reflexes and coordination. Normal sensation. Gait deferred.    ASSESSMENT/PLAN Ms. Debra Roth is a 71 y.o.  female with history of a previous stroke, hypertension, anxiety, and hyperlipidemia presenting with chest pain, slurred speech, vertigo, blurred vision, word finding difficulties and elevated blood pressure. She did not receive IV t-PA due to late presentation and resolution of deficits.  Possible TIA:    Resultant - resolution of deficits  CT head - Lacunar infarct of the posterior limb right internal capsule appears remote   MRI head - No acute findings. Remote right internal capsule lacunar infarct. MRA head and Neck - Widely patent BILATERAL internal carotid arteries and anterior circulation.RIGHT vertebral appears to be the dominant/ sole contributor to the basilar. See discussion above.Nonvisualization of LEFT vertebral. Otherwise no extracranial stenosis, dissection, or occlusion.75% stenosis, proximal RIGHT subclavian artery.  Carotid Doppler - canceled - MRA neck  2D Echo - pending  LDL - 86  HgbA1c - 4.9  VTE prophylaxis - Lovenox Diet Heart Room service appropriate? Yes; Fluid consistency: Thin  No antithrombotic prior to admission, now on aspirin 325 mg daily  Patient counseled to be compliant with her antithrombotic medications  Ongoing aggressive stroke risk factor management  Therapy recommendations:  pending  Disposition: Pending     Hypertension  Stable  Permissive hypertension (OK if < 220/120) but gradually normalize in 5-7 days  Long-term BP goal normotensive  Hyperlipidemia  Home meds:  Pravachol 20 mg daily resumed in hospital  LDL 86, goal < 70  Pravachol increased to 40 mg daily  Continue statin at discharge    Other Stroke Risk Factors  Advanced age  ETOH use, advised to drink no more than 1 drink per day  Overweight, Body mass index is 27.46 kg/m., recommend weight loss, diet and exercise as appropriate   Hx stroke/TIA   Other Active Problems  Mild thrombocytopenia  Asymptomatic right subclavian stenosi  Hospital day #  1  I have personally examined this patient, reviewed notes, independently viewed imaging studies, participated in medical decision making and plan of care.ROS completed by me personally and pertinent positives fully documented  I have made any additions or clarifications directly to the above note. . She presented with transient speech and expressive language difficulties possibly a left hemispheric TIA likely small vessel disease and. Right subclavian stenosis appears to  And can be managed conservatively.Recommend take aspirin daily as well as increased dose of Pravachol to 40 mg daily.  Discharge home after echocardiogram. Followup as an outpatient in the stroke clinic In 6 weeks Discussed with Dr. Florene Glen.  Antony Contras, MD Medical Director [place] Pager: [phone] 07/13/2017 4:35 PM   To contact Stroke Continuity provider, please refer to [email or web address] After hours, contact General Neurology

## 2017-07-13 NOTE — Progress Notes (Signed)
Physical Therapy Treatment Patient Details Name: Debra Roth MRN: [id number] DOB: 1946/03/06 Today's Date: 07/13/2017    History of Present Illness Pt is a 71 y.o. female with PMH: CVA (1990's), HTN, Anxiety who presented to hospital with slurred speech, c/o vertigo, blurred vision and difficulty with word finding. MRI did revealed remote Lacunar infarct in the posterior limb of the right internal capsule.    PT Comments    Patient with less dizziness today.  Does describe dizziness as feeling "off balance" with walls moving.  Patient with positive VOR head thrust indicating hypofunction on Lt side. (See Vestibular Rehab note above)  Patient scored 20/24 on DGI balance assessment, mild balance issues.  Patient is safe to d/c home with prn family assist.  Recommend patient f/u with OP PT for Vestibular Rehab/balance therapy at discharge.   Follow Up Recommendations  Outpatient PT (for Vestibular Rehab)     Equipment Recommendations  None recommended by PT    Recommendations for Other Services       Precautions / Restrictions Precautions Precautions: None Restrictions Weight Bearing Restrictions: No    Mobility  Bed Mobility Overal bed mobility: Independent                Transfers Overall transfer level: Independent Equipment used: None                Ambulation/Gait Ambulation/Gait assistance: Modified independent (Device/Increase time) Ambulation Distance (Feet): 200 Feet Assistive device: None Gait Pattern/deviations: Step-through pattern;Decreased stride length Gait velocity: reduced Gait velocity interpretation: Below normal speed for age/gender General Gait Details: Patient with slow, steady gait.  No loss of balance during gait.   Stairs Stairs: Yes   Stair Management: No rails;Alternating pattern;One rail Right;Step to pattern;Forwards Number of Stairs: 4 General stair comments: No rail and alternating pattern ascending stairs.  One rail  and step-to pattern descending stairs.  Supervision for safety only.  Wheelchair Mobility    Modified Rankin (Stroke Patients Only) Modified Rankin (Stroke Patients Only) Pre-Morbid Rankin Score: No symptoms Modified Rankin: Slight disability     Balance   Sitting-balance support: No upper extremity supported;Feet supported Sitting balance-Leahy Scale: Normal (Patient moving in and out of cross-legged sitting at EOB.)     Standing balance support: No upper extremity supported Standing balance-Leahy Scale: Good                   Standardized Balance Assessment Standardized Balance Assessment : Dynamic Gait Index   Dynamic Gait Index Level Surface: Normal Change in Gait Speed: Normal Gait with Horizontal Head Turns: Mild Impairment Gait with Vertical Head Turns: Normal Gait and Pivot Turn: Mild Impairment Step Over Obstacle: Normal Step Around Obstacles: Normal Steps: Moderate Impairment Total Score: 20      Cognition Arousal/Alertness: Awake/alert Behavior During Therapy: WFL for tasks assessed/performed;Anxious Overall Cognitive Status: Within Functional Limits for tasks assessed                                        Exercises      General Comments        Pertinent Vitals/Pain Pain Assessment: No/denies pain    Home Living                      Prior Function            PT Goals (current goals can now be  found in the care plan section) Acute Rehab PT Goals Patient Stated Goal: to go home Progress towards PT goals: Goals met/education completed, patient discharged from PT (to have f/u OP PT)    Frequency    Min 3X/week      PT Plan Discharge plan needs to be updated    Co-evaluation              AM-PAC PT "6 Clicks" Daily Activity  Outcome Measure  Difficulty turning over in bed (including adjusting bedclothes, sheets and blankets)?: None Difficulty moving from lying on back to sitting on the side of  the bed? : None Difficulty sitting down on and standing up from a chair with arms (e.g., wheelchair, bedside commode, etc,.)?: None Help needed moving to and from a bed to chair (including a wheelchair)?: None Help needed walking in hospital room?: None Help needed climbing 3-5 steps with a railing? : A Little 6 Click Score: 23    End of Session   Activity Tolerance: Patient tolerated treatment well Patient left: in bed;with call bell/phone within reach Nurse Communication: Mobility status (Recommend OP PT for Vest Rehab) PT Visit Diagnosis: Unsteadiness on feet (R26.81);Dizziness and giddiness (R42)     Time: 7282-0601 PT Time Calculation (min) (ACUTE ONLY): 60 min  Charges:  $Gait Training: 23-37 mins $Therapeutic Activity: 23-37 mins                    G Codes:       [name]. Sanjuana Kava, [facility] Acute Rehab Services Pager [place] 07/13/2017, 6:44 PM

## 2017-07-15 DIAGNOSIS — I771 Stricture of artery: Secondary | ICD-10-CM | POA: Diagnosis not present

## 2017-07-15 DIAGNOSIS — G459 Transient cerebral ischemic attack, unspecified: Secondary | ICD-10-CM | POA: Diagnosis not present

## 2017-07-15 DIAGNOSIS — D696 Thrombocytopenia, unspecified: Secondary | ICD-10-CM | POA: Diagnosis not present

## 2017-07-15 DIAGNOSIS — Z09 Encounter for follow-up examination after completed treatment for conditions other than malignant neoplasm: Secondary | ICD-10-CM | POA: Diagnosis not present

## 2017-07-15 NOTE — ED Provider Notes (Signed)
[facility] DEPT Provider Note   CSN: [id number] Arrival date & time: 07/11/17  1620     History   Chief Complaint Chief Complaint  Patient presents with  . Dizziness    HPI Debra Roth is a 71 y.o. female.  HPI 71 y.o. female with history of previous stroke in the 75s which affected her left side, hypertension, and anxiety was referred to the ER by patient's PCP after patient was found to have difficulty speaking.  Pt reports that she was on her way to the frocery store when she all of a sudden was unable to speak and that her words were slurred. In the ER the symptoms have resolved. PT also reports maybe forgetting certain things which is not common for her.   Past Medical History:  Diagnosis Date  . Allergy   . Anxiety   . Hypertension   . Stroke [facility]) 1990    Patient Active Problem List   Diagnosis Date Noted  . Bradycardia 07/13/2017  . Vestibular dysfunction 07/13/2017  . TIA (transient ischemic attack) 07/11/2017  . 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 History of shingles 06/08/2015  . Adjustment disorder with mixed anxiety and depressed mood 06/03/2014  . Essential hypertension 06/03/2014  . Gastroesophageal reflux disease without esophagitis 06/03/2014  . Diarrhea 06/03/2014  . Sleep disturbance 06/03/2014  . Hyperlipidemia 06/03/2014    Past Surgical History:  Procedure Laterality Date  . BREAST REDUCTION SURGERY Bilateral 1993  . BREAST SURGERY Bilateral    15 years ago  . COSMETIC SURGERY     breast reduction  . FRACTURE SURGERY Left    left arm and shoulder  . OTHER SURGICAL HISTORY     blephmospasms-eyes  . REDUCTION MAMMAPLASTY      OB History    No data available       Home Medications    Prior to Admission medications   Medication Sig Start Date End Date Taking? Authorizing Provider  botulinum toxin Type A (BOTOX) 100 units SOLR injection Inject 33.5 Units as directed every 3 (three) months. 08/08/16  Yes [provider]    buPROPion (WELLBUTRIN XL) 150 MG 24 hr tablet TAKE 1 TABLET (150 MG TOTAL) BY MOUTH DAILY. 12/16/16  Yes Tereasa Coop, PA-C  calcium carbonate (TUMS - DOSED IN MG ELEMENTAL CALCIUM) 500 MG chewable tablet Chew 2 tablets by mouth at bedtime.    Yes [provider]  cetirizine (ZYRTEC) 10 MG tablet Take 10 mg by mouth daily as needed for allergies.   Yes [provider]  diazepam (VALIUM) 5 MG tablet Take one at night and one additional in daytime if needed for anxiety and panic Patient taking differently: Take 5 mg by mouth daily. Take one at night and one additional in daytime if needed for anxiety and panic 07/05/16  Yes Robyn Haber, MD  loperamide (IMODIUM) 2 MG capsule Take 10 mg by mouth daily as needed for diarrhea or loose stools.    Yes [provider]  metoprolol tartrate (LOPRESSOR) 25 MG tablet TAKE 1 TABLET (25 MG TOTAL) BY MOUTH DAILY. Patient taking differently: Take 25 mg by mouth at bedtime.  12/14/16  Yes Jeffery, Chelle, PA-C  Multiple Vitamins-Minerals (DRY EYE FORMULA PO) Place 2 drops into both eyes as needed.   Yes [provider]  omeprazole (PRILOSEC) 20 MG capsule Take 20 mg by mouth as needed.    Yes [provider]  ramipril (ALTACE) 5 MG capsule TAKE 1 CAPSULE (5 MG TOTAL) BY  MOUTH DAILY. 12/14/16  Yes Jeffery, Chelle, PA-C  aspirin 325 MG tablet Take 1 tablet (325 mg total) by mouth daily. 07/14/17   Ladene Artist., MD  pravastatin (PRAVACHOL) 40 MG tablet Take 1 tablet (40 mg total) by mouth daily at 6 PM. 07/13/17   Ladene Artist., MD    Family History Family History  Problem Relation Age of Onset  . Cancer Mother   . Cancer Father   . Heart disease Brother   . Hypertension Brother   . Hyperlipidemia Brother   . Hypertension Brother   . Cancer Daughter   . Hypertension Daughter     Social History Social History  Substance Use Topics  . Smoking status: Never Smoker  . Smokeless tobacco: Never Used   . Alcohol use 3.0 oz/week    5 Standard drinks or equivalent per week     Comment: occasionally drinks wine     Allergies   Pollen extract   Review of Systems Review of Systems  All other systems reviewed and are negative.    Physical Exam Updated Vital Signs BP (!) 153/61 (BP Location: Right Arm)   Pulse 64   Temp 97.8 F (36.6 C) (Axillary)   Resp 18   Ht 5\' 2"  (1.575 m)   Wt 68.1 kg (150 lb 2.1 oz)   SpO2 99%   BMI 27.46 kg/m   Physical Exam  Constitutional: She is oriented to person, place, and time. She appears well-developed.  HENT:  Head: Normocephalic and atraumatic.  Eyes: EOM are normal.  Neck: Normal range of motion. Neck supple.  Cardiovascular: Normal rate.   Pulmonary/Chest: Effort normal.  Abdominal: Bowel sounds are normal.  Neurological: She is alert and oriented to person, place, and time. No cranial nerve deficit. Coordination normal.  Skin: Skin is warm and dry.  Nursing note and vitals reviewed.    ED Treatments / Results  Labs (all labs ordered are listed, but only abnormal results are displayed) Labs Reviewed  CBC - Abnormal; Notable for the following:       Result Value   Platelets 142 (*)    All other components within normal limits  COMPREHENSIVE METABOLIC PANEL - Abnormal; Notable for the following:    BUN 26 (*)    Calcium 8.8 (*)    GFR calc non Af Amer 59 (*)    All other components within normal limits  RAPID URINE DRUG SCREEN, HOSP PERFORMED - Abnormal; Notable for the following:    Benzodiazepines POSITIVE (*)    All other components within normal limits  URINALYSIS, ROUTINE W REFLEX MICROSCOPIC - Abnormal; Notable for the following:    Leukocytes, UA SMALL (*)    Bacteria, UA RARE (*)    Squamous Epithelial / LPF 0-5 (*)    All other components within normal limits  CBC - Abnormal; Notable for the following:    Platelets 136 (*)    All other components within normal limits  COMPREHENSIVE METABOLIC PANEL -  Abnormal; Notable for the following:    Calcium 8.7 (*)    Total Protein 6.4 (*)    All other components within normal limits  URINE CULTURE  PROTIME-INR  APTT  DIFFERENTIAL  HEMOGLOBIN A1C  LIPID PANEL  I-STAT TROPONIN, ED    EKG  EKG Interpretation  Date/Time:  Friday July 11 2017 17:16:37 EDT Ventricular Rate:  63 PR Interval:    QRS Duration: 92 QT Interval:  446 QTC Calculation: 457 R Axis:  47 Text Interpretation:  Sinus rhythm Low voltage, precordial leads Confirmed by Thayer Jew [phone]) on 07/12/2017 3:05:11 PM       Radiology No results found.  Procedures Procedures (including critical care time)  Medications Ordered in ED Medications  LORazepam (ATIVAN) injection 1 mg (1 mg Intravenous Given 07/11/17 1930)   stroke: mapping our early stages of recovery book ( Does not apply Given 07/11/17 2345)  gadobenate dimeglumine (MULTIHANCE) injection 15 mL (14 mLs Intravenous Contrast Given 07/12/17 1045)     Initial Impression / Assessment and Plan / ED Course  I have reviewed the triage vital signs and the nursing notes.  Pertinent labs & imaging results that were available during my care of the patient were reviewed by me and considered in my medical decision making (see chart for details).     Pt with possible TIA. MRI is neg for acute stroke. ED workup is neg. Pt is deeply concerned about possible TIA and would prefer getting inpatient workup. ABCD2 score is high, and neurology is fine with pt being admitted.  Final Clinical Impressions(s) / ED Diagnoses   Final diagnoses:  Transient cerebral ischemia, unspecified type    New Prescriptions Discharge Medication List as of 07/13/2017  6:53 PM    START taking these medications   Details  aspirin 325 MG tablet Take 1 tablet (325 mg total) by mouth daily., Starting Mon 07/14/2017, Normal         Varney Biles, MD 07/15/17 [phone]

## 2017-07-18 ENCOUNTER — Other Ambulatory Visit: Payer: Self-pay

## 2017-07-18 DIAGNOSIS — I771 Stricture of artery: Secondary | ICD-10-CM

## 2017-07-25 DIAGNOSIS — I10 Essential (primary) hypertension: Secondary | ICD-10-CM | POA: Diagnosis not present

## 2017-07-25 DIAGNOSIS — I771 Stricture of artery: Secondary | ICD-10-CM | POA: Diagnosis not present

## 2017-07-25 DIAGNOSIS — G451 Carotid artery syndrome (hemispheric): Secondary | ICD-10-CM | POA: Diagnosis not present

## 2017-07-25 DIAGNOSIS — R479 Unspecified speech disturbances: Secondary | ICD-10-CM | POA: Diagnosis not present

## 2017-07-25 DIAGNOSIS — I161 Hypertensive emergency: Secondary | ICD-10-CM | POA: Diagnosis not present

## 2017-07-25 DIAGNOSIS — Z8673 Personal history of transient ischemic attack (TIA), and cerebral infarction without residual deficits: Secondary | ICD-10-CM | POA: Diagnosis not present

## 2017-07-28 ENCOUNTER — Ambulatory Visit: Payer: Medicare Other | Attending: Family Medicine | Admitting: Physical Therapy

## 2017-07-28 DIAGNOSIS — I10 Essential (primary) hypertension: Secondary | ICD-10-CM | POA: Diagnosis not present

## 2017-08-05 ENCOUNTER — Encounter: Payer: Medicare Other | Admitting: Vascular Surgery

## 2017-08-05 ENCOUNTER — Inpatient Hospital Stay (HOSPITAL_COMMUNITY): Admission: RE | Admit: 2017-08-05 | Payer: Medicare Other | Source: Ambulatory Visit

## 2017-08-12 DIAGNOSIS — I771 Stricture of artery: Secondary | ICD-10-CM | POA: Diagnosis not present

## 2017-09-02 ENCOUNTER — Encounter: Payer: Medicare Other | Admitting: Vascular Surgery

## 2017-09-02 ENCOUNTER — Encounter (HOSPITAL_COMMUNITY): Payer: Medicare Other

## 2017-09-10 DIAGNOSIS — G245 Blepharospasm: Secondary | ICD-10-CM | POA: Diagnosis not present

## 2017-09-16 ENCOUNTER — Telehealth: Payer: Self-pay | Admitting: Physical Therapy

## 2017-09-16 NOTE — Telephone Encounter (Signed)
07/28/17 pt no showed for PT eval, called on 09/16/17 to see if she wants to reschedule. No return call

## 2017-09-24 DIAGNOSIS — I10 Essential (primary) hypertension: Secondary | ICD-10-CM | POA: Diagnosis not present

## 2018-08-05 IMAGING — MR MR MRA HEAD W/O CM
8 series · 16 of 16 positions shown · IV contrast (multihance)
Comparison: MR brain 07/11/2017 did not show an acute stroke.

CLINICAL DATA: Transient ischemic attack with slurred speech.

EXAM:
MRA NECK WITHOUT AND WITH CONTRAST
MRA HEAD WITHOUT CONTRAST
TECHNIQUE: Multiplanar and multiecho pulse sequences of the neck were obtained
without and with intravenous contrast. Angiographic images of the
neck were obtained using MRA technique without and with intravenous
contrast.; Angiographic images of the Circle of Willis were obtained
using MRA technique without intravenous contrast.
CONTRAST:  14mL MULTIHANCE GADOBENATE DIMEGLUMINE 529 MG/ML IV SOLN

[Series 3: ax (id) 2 · axial · 1.0mm · 0.43mm/px · z∈[-36,+55]mm · 3 of 184 slices shown]
[im 1/184]
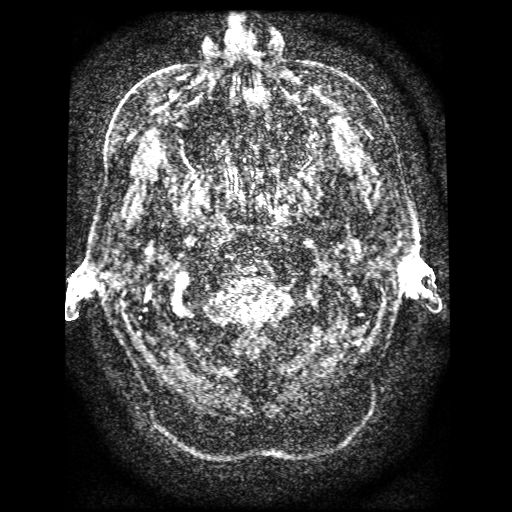
[im 92/184]
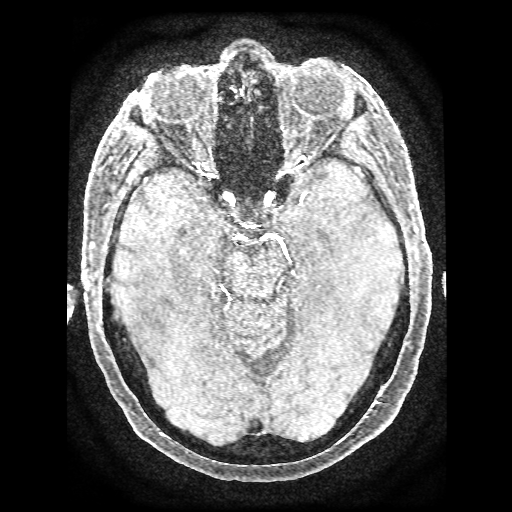
[im 184/184]
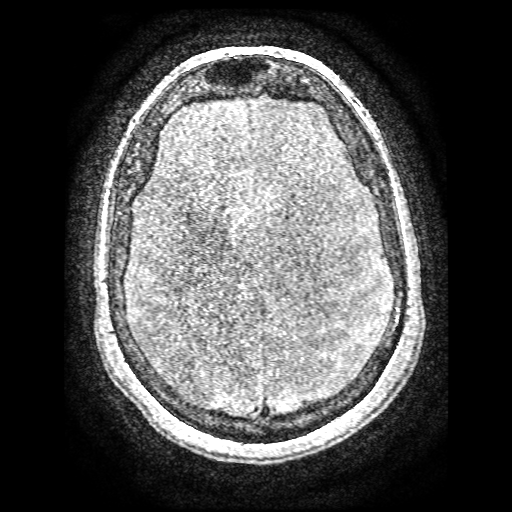

[Series 6: TOF · axial · 2.4mm · 0.47mm/px · z∈[-177,+4]mm · 3 of 152 slices shown]
[im 1/152]
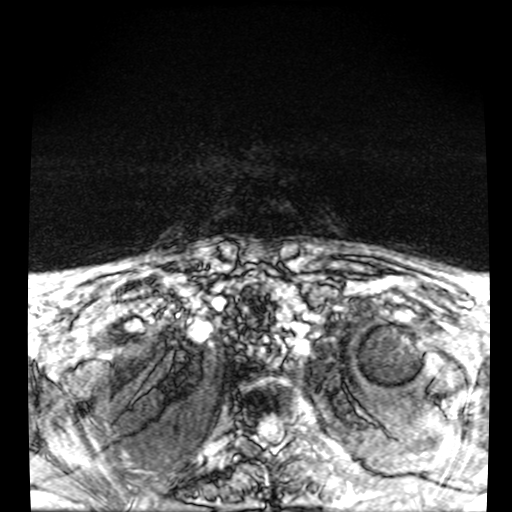
[im 76/152]
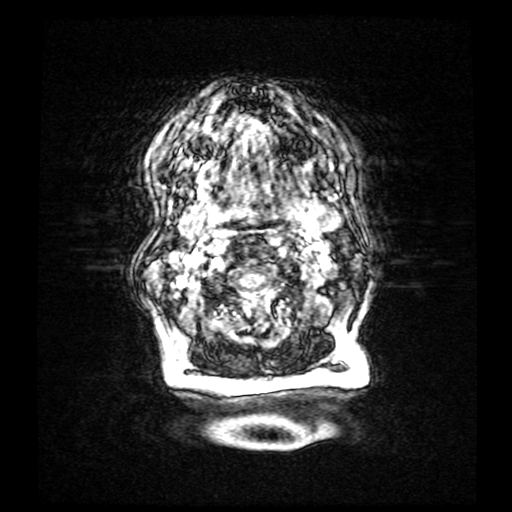
[im 152/152]
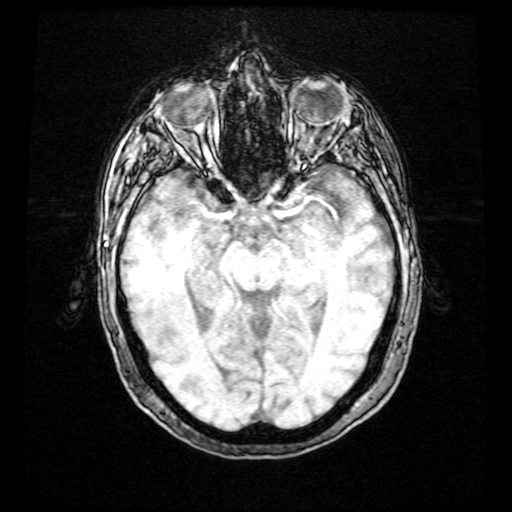

[Series 8: sag inhance (id) · sagittal · 1.2mm · 0.47mm/px · 5 of 352 slices shown]
[im 1/352]
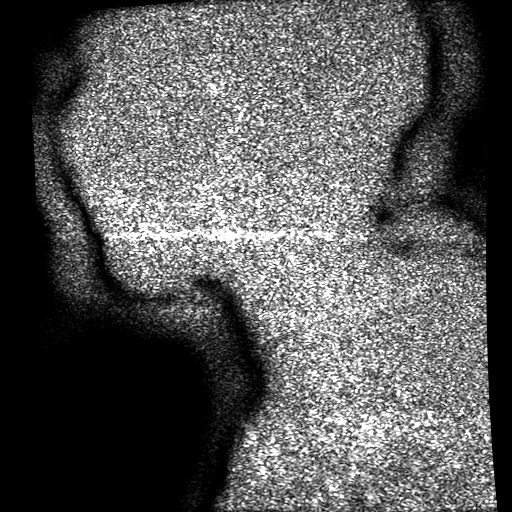
[im 88/352]
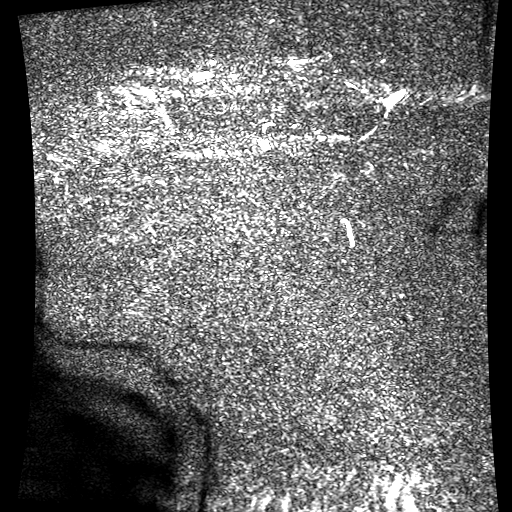
[im 176/352]
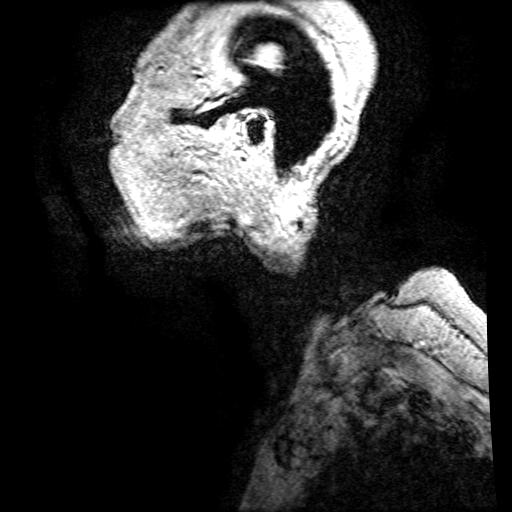
[im 264/352]
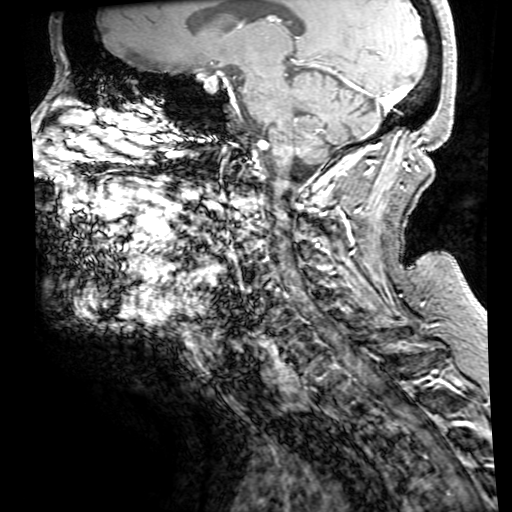
[im 352/352]
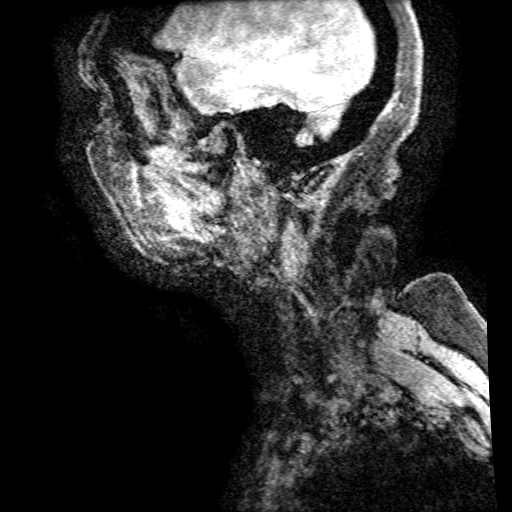

[Series 700: cor cemra ft · coronal · 1.2mm · 0.59mm/px · 1 of 113 slices shown]
[im 1/113]
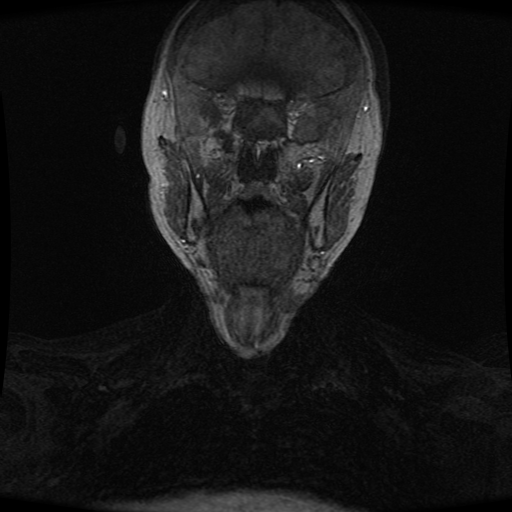

[Series 701: ph1/cor cemra ft · coronal · 1.2mm · 0.59mm/px · 1 of 112 slices shown]
[im 1/112]
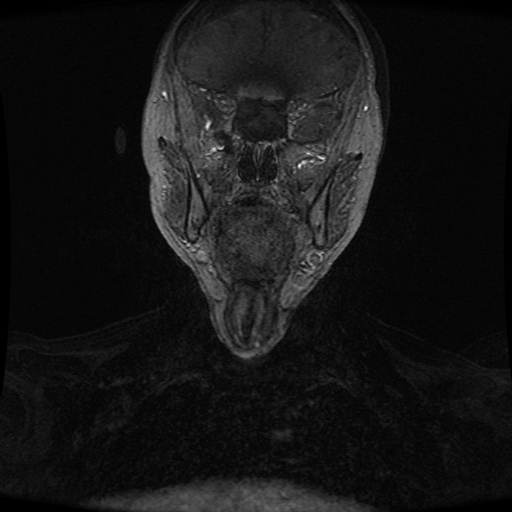

[Series 702: ph2/cor cemra ft · coronal · 1.2mm · 0.59mm/px · 1 of 112 slices shown]
[im 1/112]
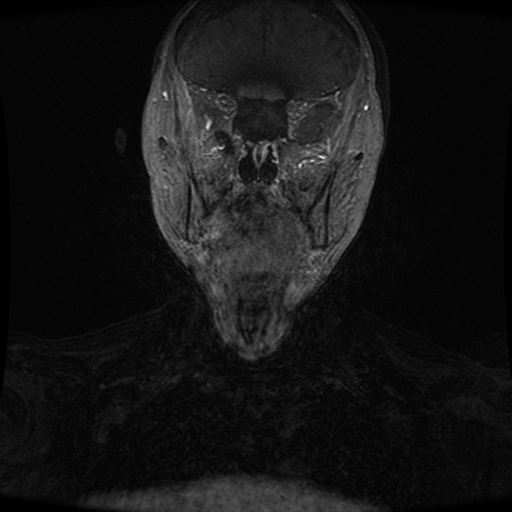

[((id)/701/1)-((id)/700/1) · coronal · 1.2mm · 0.59mm/px · 1 of 113 slices shown]
[im 1/113]
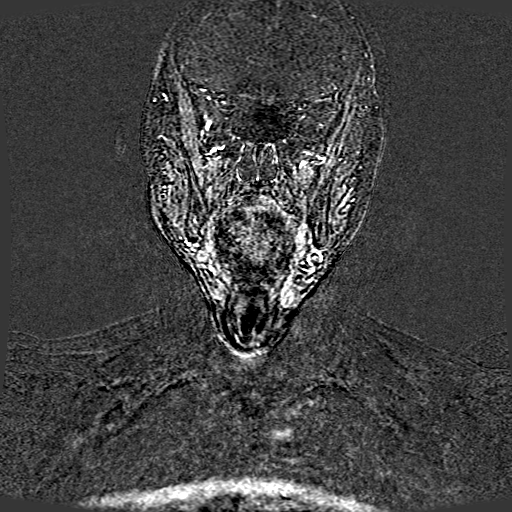

[((id)/702/1)-((id)/700/1) · coronal · 1.2mm · 0.59mm/px · 1 of 113 slices shown]
[im 1/113]
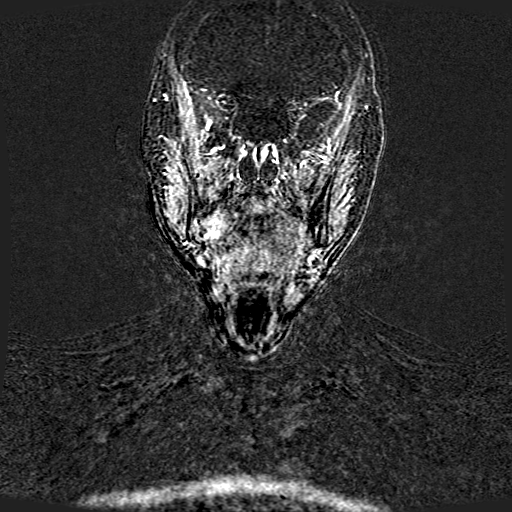

[16 of 16 positions shown; findings below may reference images not displayed]

FINDINGS: MRA NECK FINDINGS

Conventional branching of the great vessels from the arch. 75%
stenosis of the proximal RIGHT subclavian.

Both common carotid arteries are widely patent. Both carotid
bifurcations are free of disease. There is no cervical ICA
dissection or fibromuscular change.

The RIGHT vertebral is the dominant/sole contributor to the basilar.
I do not see the LEFT vertebral arising from the arch or LEFT
subclavian.

MRA HEAD FINDINGS

The internal carotid arteries are widely patent. There is no
proximal stenosis of the anterior or middle cerebral arteries. No
intracranial branch occlusion is evident.

The basilar artery is widely patent with RIGHT vertebral as the sole
contributor. I do not see retrograde filling of the LEFT vertebral.
There is a prominent LEFT AICA/PICA trunk which supplies the LEFT
cerebellum. RIGHT AICA poorly seen. RIGHT PICA unremarkable.
BILATERAL superior cerebellar arteries are patent, with some disease
on the LEFT proximally. Posterior cerebral arteries widely patent.

No saccular aneurysm.
IMPRESSION: Widely patent BILATERAL internal carotid arteries and anterior
circulation.

RIGHT vertebral appears to be the dominant/ sole contributor to the
basilar. See discussion above.

Nonvisualization of LEFT vertebral. Otherwise no extracranial
stenosis, dissection, or occlusion.

75% stenosis, proximal RIGHT subclavian artery.

## 2018-09-16 ENCOUNTER — Other Ambulatory Visit: Payer: Self-pay | Admitting: Family Medicine

## 2018-09-16 DIAGNOSIS — R928 Other abnormal and inconclusive findings on diagnostic imaging of breast: Secondary | ICD-10-CM

## 2018-09-22 ENCOUNTER — Other Ambulatory Visit: Payer: Self-pay | Admitting: Family Medicine

## 2018-09-22 DIAGNOSIS — R928 Other abnormal and inconclusive findings on diagnostic imaging of breast: Secondary | ICD-10-CM

## 2018-09-23 ENCOUNTER — Other Ambulatory Visit: Payer: Self-pay

## 2018-09-23 ENCOUNTER — Ambulatory Visit: Admission: RE | Admit: 2018-09-23 | Payer: 59 | Source: Ambulatory Visit

## 2018-09-23 ENCOUNTER — Ambulatory Visit
Admission: RE | Admit: 2018-09-23 | Discharge: 2018-09-23 | Disposition: A | Discharge status: Home / Self Care | Payer: Medicare Other | Source: Ambulatory Visit | Attending: Family Medicine | Admitting: Family Medicine

## 2018-09-23 DIAGNOSIS — R928 Other abnormal and inconclusive findings on diagnostic imaging of breast: Secondary | ICD-10-CM

## 2020-06-30 ENCOUNTER — Other Ambulatory Visit: Payer: Self-pay | Admitting: Family Medicine

## 2020-06-30 DIAGNOSIS — N6489 Other specified disorders of breast: Secondary | ICD-10-CM

## 2020-06-30 DIAGNOSIS — R921 Mammographic calcification found on diagnostic imaging of breast: Secondary | ICD-10-CM

## 2020-06-30 DIAGNOSIS — Z780 Asymptomatic menopausal state: Secondary | ICD-10-CM

## 2020-07-18 ENCOUNTER — Other Ambulatory Visit: Payer: 59

## 2020-10-04 ENCOUNTER — Other Ambulatory Visit: Payer: 59

## 2021-01-16 ENCOUNTER — Other Ambulatory Visit: Payer: Medicare Other

## 2023-05-04 ENCOUNTER — Emergency Department (HOSPITAL_COMMUNITY): Payer: Medicare Other

## 2023-05-04 ENCOUNTER — Encounter (HOSPITAL_COMMUNITY): Payer: Self-pay | Admitting: *Deleted

## 2023-05-04 ENCOUNTER — Emergency Department (HOSPITAL_COMMUNITY)
Admission: EM | Admit: 2023-05-04 | Discharge: 2023-05-04 | Disposition: A | Discharge status: Home / Self Care | Payer: Medicare Other | Attending: Emergency Medicine | Admitting: Emergency Medicine

## 2023-05-04 ENCOUNTER — Other Ambulatory Visit: Payer: Self-pay

## 2023-05-04 DIAGNOSIS — W01198A Fall on same level from slipping, tripping and stumbling with subsequent striking against other object, initial encounter: Secondary | ICD-10-CM | POA: Insufficient documentation

## 2023-05-04 DIAGNOSIS — M5431 Sciatica, right side: Secondary | ICD-10-CM

## 2023-05-04 DIAGNOSIS — Z7982 Long term (current) use of aspirin: Secondary | ICD-10-CM | POA: Insufficient documentation

## 2023-05-04 DIAGNOSIS — M48061 Spinal stenosis, lumbar region without neurogenic claudication: Secondary | ICD-10-CM | POA: Insufficient documentation

## 2023-05-04 DIAGNOSIS — M545 Low back pain, unspecified: Secondary | ICD-10-CM | POA: Diagnosis present

## 2023-05-04 LAB — BASIC METABOLIC PANEL
Anion gap: 9 (ref 5–15)
BUN: 11 mg/dL (ref 8–23)
CO2: 27 mmol/L (ref 22–32)
Calcium: 8.8 mg/dL — ABNORMAL LOW (ref 8.9–10.3)
Chloride: 93 mmol/L — ABNORMAL LOW (ref 98–111)
Creatinine, Ser: 0.76 mg/dL (ref 0.44–1.00)
GFR, Estimated: >60 mL/min (ref >60)
Glucose, Bld: 90 mg/dL (ref 70–99)
Potassium: 3.4 mmol/L — ABNORMAL LOW (ref 3.5–5.1)
Sodium: 129 mmol/L — ABNORMAL LOW (ref 135–145)

## 2023-05-04 LAB — CBC WITH DIFFERENTIAL/PLATELET
Abs Immature Granulocytes: 0.01 10*3/uL (ref 0.00–0.07)
Basophils Absolute: 0 10*3/uL (ref 0.0–0.1)
Basophils Relative: 0 %
Eosinophils Absolute: 0.1 10*3/uL (ref 0.0–0.5)
Eosinophils Relative: 2 %
HCT: 41.7 % (ref 36.0–46.0)
Hemoglobin: 14.7 g/dL (ref 12.0–15.0)
Immature Granulocytes: 0 %
Lymphocytes Relative: 29 %
Lymphs Abs: 1.5 10*3/uL (ref 0.7–4.0)
MCH: 32.1 pg (ref 26.0–34.0)
MCHC: 35.3 g/dL (ref 30.0–36.0)
MCV: 91 fL (ref 80.0–100.0)
Monocytes Absolute: 0.3 10*3/uL (ref 0.1–1.0)
Monocytes Relative: 7 %
Neutro Abs: 3.1 10*3/uL (ref 1.7–7.7)
Neutrophils Relative %: 62 %
Platelets: 144 10*3/uL — ABNORMAL LOW (ref 150–400)
RBC: 4.58 MIL/uL (ref 3.87–5.11)
RDW: 11.4 % — ABNORMAL LOW (ref 11.5–15.5)
WBC: 5.1 10*3/uL (ref 4.0–10.5)
nRBC: 0 % (ref 0.0–0.2)

## 2023-05-04 MED ORDER — METHYLPREDNISOLONE 4 MG PO TBPK: ORAL_TABLET | ORAL | 0 refills | Status: DC

## 2023-05-04 MED ORDER — NAPROXEN 375 MG PO TABS: 375.0000 mg | ORAL_TABLET | Freq: Two times a day (BID) | ORAL | 0 refills | Status: DC

## 2023-05-04 MED ORDER — DEXAMETHASONE SODIUM PHOSPHATE 10 MG/ML IJ SOLN
10.0000 mg | Freq: Once | INTRAMUSCULAR | Status: AC
  Administered 2023-05-04: 10 mg via INTRAVENOUS
  Filled 2023-05-04: qty 1

## 2023-05-04 MED ORDER — KETOROLAC TROMETHAMINE 15 MG/ML IJ SOLN
15.0000 mg | Freq: Once | INTRAMUSCULAR | Status: AC
  Administered 2023-05-04: 15 mg via INTRAVENOUS
  Filled 2023-05-04: qty 1

## 2023-05-04 MED ORDER — ACETAMINOPHEN 500 MG PO TABS: 1000.0000 mg | ORAL_TABLET | Freq: Three times a day (TID) | ORAL | 0 refills | Status: AC

## 2023-05-04 MED ORDER — LIDOCAINE 5 % EX PTCH
1.0000 | MEDICATED_PATCH | CUTANEOUS | Status: DC
  Administered 2023-05-04: 1 via TRANSDERMAL
  Filled 2023-05-04: qty 1

## 2023-05-04 MED ORDER — LIDOCAINE 5 % EX PTCH: 1.0000 | MEDICATED_PATCH | CUTANEOUS | 0 refills | Status: DC

## 2023-05-04 MED ORDER — LACTATED RINGERS IV SOLN: INTRAVENOUS | Status: DC

## 2023-05-04 NOTE — ED Triage Notes (Signed)
PT here via [facility] from home for multiple falls after receiving PT for sciatica of the L leg on Tuesday.  Pt fell twice on tailbone, hitting head after she landed.    Pt called EMS because she was in so much pain that she could barely get to the toilet.  AO x 4.  No neuro deficits.

## 2023-05-04 NOTE — ED Notes (Signed)
Pt placed on purewic 

## 2023-05-04 NOTE — ED Provider Notes (Signed)
  Physical Exam  BP (!) 141/126   Pulse 61   Temp 97.8 F (36.6 C) (Oral)   Resp 16   Ht 5\' 2"  (1.575 m)   Wt 68.1 kg   SpO2 100%   BMI 27.46 kg/m   Physical Exam Vitals and nursing note reviewed.  Constitutional:      General: She is not in acute distress.    Appearance: She is well-developed.  HENT:     Head: Normocephalic and atraumatic.  Eyes:     Conjunctiva/sclera: Conjunctivae normal.  Cardiovascular:     Rate and Rhythm: Normal rate and regular rhythm.     Heart sounds: No murmur heard. Pulmonary:     Effort: Pulmonary effort is normal. No respiratory distress.     Breath sounds: Normal breath sounds.  Abdominal:     Palpations: Abdomen is soft.     Tenderness: There is no abdominal tenderness.  Musculoskeletal:        General: Tenderness present. No swelling.     Cervical back: Neck supple.  Skin:    General: Skin is warm and dry.     Capillary Refill: Capillary refill takes less than 2 seconds.  Neurological:     Mental Status: She is alert.  Psychiatric:        Mood and Affect: Mood normal.     Procedures  Procedures  ED Course / MDM   Clinical Course as of 05/04/23 1911  Wynelle Link May 04, 2023  1533 Pending NSG [MK]    Clinical Course User Index [MK] Vyolet Sakuma, Wyn Forster, MD   Medical Decision Making Amount and/or Complexity of Data Reviewed Labs: ordered. Radiology: ordered.  Risk OTC drugs. Prescription drug management.   Patient received in handoff.  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  Pending neurosurgery recommendations.  Spoke with neurosurgeon on-call who is recommending steroid initiation and pain control and outpatient follow-up.  We were able to successfully control the patient's pain with Decadron, Toradol and a lidocaine patch.  She will start a pain regimen of Naprosyn, Lidoderm, Tylenol and a Medrol Dosepak and an outpatient referral was sent to the [place] [facility] which she will call tomorrow for  follow-up.  At this time she does not meet inpatient criteria for admission she is safe for discharge with outpatient follow-up       Glendora Score, MD 05/04/23 2015

## 2023-05-04 NOTE — ED Notes (Signed)
Pt ambulated with assistance. She states she is unsteady and very "nervous" about falling again. She was able to take a few steps without assistance but was very unsure.

## 2023-05-04 NOTE — Discharge Instructions (Addendum)
For pain:  - Acetaminophen 1000 mg three times daily (every 8 hours) - Naproxen 2 times daily (every 12 hours) - lidoderm patches twice daily - steroid pack for 5 days

## 2023-05-04 NOTE — ED Provider Notes (Signed)
[place] [facility] AT [facility] Provider Note   CSN: [id number] Arrival date & time: 05/04/23  1330     History  Chief Complaint  Patient presents with   Debra Roth is a 77 y.o. female.  77 year old female presents with low lower back pain rating to her left leg.  Patient has a history of sciatica time but has been having issues with this.  Was with his states that she tries to stand up and her left leg gives out on her.  Today she bent over to do something and fell onto her butt and did fall backwards and strike her head.  Denies any loss of consciousness.  Does not use any blood thinners.  States that she has been using her walker.  Denies any foot drop when trying to walk       Home Medications Prior to Admission medications   Medication Sig Start Date End Date Taking? Authorizing Provider  aspirin 325 MG tablet Take 1 tablet (325 mg total) by mouth daily. 07/14/17   Zigmund Daniel., MD  botulinum toxin Type A (BOTOX) 100 units SOLR injection Inject 33.5 Units as directed every 3 (three) months. 08/08/16   [provider]  buPROPion (WELLBUTRIN XL) 150 MG 24 hr tablet TAKE 1 TABLET (150 MG TOTAL) BY MOUTH DAILY. 12/16/16   Ofilia Neas, PA-C  calcium carbonate (TUMS - DOSED IN MG ELEMENTAL CALCIUM) 500 MG chewable tablet Chew 2 tablets by mouth at bedtime.     [provider]  cetirizine (ZYRTEC) 10 MG tablet Take 10 mg by mouth daily as needed for allergies.    [provider]  diazepam (VALIUM) 5 MG tablet Take one at night and one additional in daytime if needed for anxiety and panic Patient taking differently: Take 5 mg by mouth daily. Take one at night and one additional in daytime if needed for anxiety and panic 07/05/16   Elvina Sidle, MD  loperamide (IMODIUM) 2 MG capsule Take 10 mg by mouth daily as needed for diarrhea or loose stools.     [provider]  metoprolol tartrate  (LOPRESSOR) 25 MG tablet TAKE 1 TABLET (25 MG TOTAL) BY MOUTH DAILY. Patient taking differently: Take 25 mg by mouth at bedtime.  12/14/16   Porfirio Oar, PA  Multiple Vitamins-Minerals (DRY EYE FORMULA PO) Place 2 drops into both eyes as needed.    [provider]  omeprazole (PRILOSEC) 20 MG capsule Take 20 mg by mouth as needed.     [provider]  pravastatin (PRAVACHOL) 40 MG tablet Take 1 tablet (40 mg total) by mouth daily at 6 PM. 07/13/17   Zigmund Daniel., MD  ramipril (ALTACE) 5 MG capsule TAKE 1 CAPSULE (5 MG TOTAL) BY MOUTH DAILY. 12/14/16   Porfirio Oar, PA      Allergies    Pollen extract    Review of Systems   Review of Systems  All other systems reviewed and are negative.   Physical Exam Updated Vital Signs BP (!) 188/80 (BP Location: Right Arm)   Pulse (!) 57   Temp 97.7 F (36.5 C) (Oral)   Resp 16   Ht 1.575 m (5\' 2" )   Wt 68.1 kg   SpO2 100%   BMI 27.46 kg/m  Physical Exam Vitals and nursing note reviewed.  Constitutional:      General: She is not in acute distress.    Appearance: Normal  appearance. She is well-developed. She is not toxic-appearing.  HENT:     Head: Normocephalic and atraumatic.  Eyes:     General: Lids are normal.     Conjunctiva/sclera: Conjunctivae normal.     Pupils: Pupils are equal, round, and reactive to light.  Neck:     Thyroid: No thyroid mass.     Trachea: No tracheal deviation.  Cardiovascular:     Rate and Rhythm: Normal rate and regular rhythm.     Heart sounds: Normal heart sounds. No murmur heard.    No gallop.  Pulmonary:     Effort: Pulmonary effort is normal. No respiratory distress.     Breath sounds: Normal breath sounds. No stridor. No decreased breath sounds, wheezing, rhonchi or rales.  Abdominal:     General: There is no distension.     Palpations: Abdomen is soft.     Tenderness: There is no abdominal tenderness. There is no rebound.  Musculoskeletal:        General: No  tenderness. Normal range of motion.     Cervical back: Normal range of motion and neck supple.       Back:       Legs:     Comments: Strength is 5/5 in upper as well as lower extremities.  No evidence of foot drop of the left foot.    Skin:    General: Skin is warm and dry.     Findings: No abrasion or rash.  Neurological:     Mental Status: She is alert and oriented to person, place, and time. Mental status is at baseline.     GCS: GCS eye subscore is 4. GCS verbal subscore is 5. GCS motor subscore is 6.     Cranial Nerves: No cranial nerve deficit.     Sensory: No sensory deficit.     Motor: Motor function is intact.  Psychiatric:        Attention and Perception: Attention normal.        Speech: Speech normal.        Behavior: Behavior normal.     ED Results / Procedures / Treatments   Labs (all labs ordered are listed, but only abnormal results are displayed) Labs Reviewed - No data to display  EKG None  Radiology No results found.  Procedures Procedures    Medications Ordered in ED Medications - No data to display  ED Course/ Medical Decision Making/ A&P                             Medical Decision Making Amount and/or Complexity of Data Reviewed Radiology: ordered.   Patient CT of LS spine which showed no fractures but moderate foraminal stenosis at L2-L3 and L2 and L4.  Also stenosis at L4-L5.  Patient states that when she tries to stand up she cannot do this.  Notes that over the last week she has had to use a walker.  Is that while here in department, and she has had trouble with spasms here.  Will consult neurosurgery to take a look at her films.  Will sign off next provider        Final Clinical Impression(s) / ED Diagnoses Final diagnoses:  None    Rx / DC Orders ED Discharge Orders     None         Lorre Nick, MD 05/04/23 [phone]

## 2023-05-09 ENCOUNTER — Other Ambulatory Visit (HOSPITAL_COMMUNITY): Payer: Self-pay | Admitting: Neurosurgery

## 2023-05-09 DIAGNOSIS — M48062 Spinal stenosis, lumbar region with neurogenic claudication: Secondary | ICD-10-CM

## 2023-05-14 ENCOUNTER — Ambulatory Visit (HOSPITAL_COMMUNITY)
Admission: RE | Admit: 2023-05-14 | Discharge: 2023-05-14 | Disposition: A | Discharge status: Home / Self Care | Payer: Medicare Other | Source: Ambulatory Visit | Attending: Neurosurgery | Admitting: Neurosurgery

## 2023-05-14 DIAGNOSIS — M48062 Spinal stenosis, lumbar region with neurogenic claudication: Secondary | ICD-10-CM | POA: Diagnosis present

## 2023-05-30 ENCOUNTER — Other Ambulatory Visit: Payer: Self-pay | Admitting: Neurosurgery

## 2023-06-04 ENCOUNTER — Ambulatory Visit (INDEPENDENT_AMBULATORY_CARE_PROVIDER_SITE_OTHER): Payer: Medicare Other

## 2023-06-04 ENCOUNTER — Ambulatory Visit (HOSPITAL_COMMUNITY)
Admission: EM | Admit: 2023-06-04 | Discharge: 2023-06-04 | Disposition: A | Discharge status: Home / Self Care | Payer: Medicare Other

## 2023-06-04 ENCOUNTER — Other Ambulatory Visit: Payer: Self-pay

## 2023-06-04 ENCOUNTER — Encounter (HOSPITAL_COMMUNITY): Payer: Self-pay | Admitting: Emergency Medicine

## 2023-06-04 DIAGNOSIS — R0782 Intercostal pain: Secondary | ICD-10-CM | POA: Diagnosis not present

## 2023-06-04 DIAGNOSIS — S2242XA Multiple fractures of ribs, left side, initial encounter for closed fracture: Secondary | ICD-10-CM

## 2023-06-04 DIAGNOSIS — W19XXXA Unspecified fall, initial encounter: Secondary | ICD-10-CM | POA: Diagnosis not present

## 2023-06-04 MED ORDER — LIDOCAINE 5 % EX PTCH: 1.0000 | MEDICATED_PATCH | CUTANEOUS | 0 refills | Status: AC

## 2023-06-04 MED ORDER — TRAMADOL HCL 50 MG PO TABS: 50.0000 mg | ORAL_TABLET | Freq: Two times a day (BID) | ORAL | 0 refills | Status: AC | PRN

## 2023-06-04 NOTE — Discharge Instructions (Addendum)
I am still waiting on your results.  I will contact you if they show a broken rib.  Use lidocaine patches to help with your pain.  You can continue over-the-counter medications including Tylenol and ibuprofen.  Follow-up with your primary care soon as possible.  If anything worsens you have increasing pain, difficulty breathing, severe cough you need to be seen immediately.

## 2023-06-04 NOTE — ED Provider Notes (Signed)
[facility]    CSN: [id number] Arrival date & time: 06/04/23  7829      History   Chief Complaint Chief Complaint  Patient presents with   Fall    HPI Debra Roth is a 77 y.o. female.   Patient presents today with a weeklong history of severe left rib pain following fall.  Reports that she was bending over to help her dog who has cancer when her left leg became weak and caused her to fall.  She has a history of lower back pain with associated sciatica and intermittent leg weakness and is scheduled to undergo laminectomy in the near future.  She did bump her head on the wall but denies any associated loss of consciousness, headache, dizziness, nausea, vomiting.  She does not take blood thinning medication on a regular basis.  Her primary concern today is significant rib pain.  She reports that this is rated 10 on a 0-10 pain scale, worse with a deep breath or movement, no alleviating factors identified.  She denies any significant shortness of breath but does report that she does not feel she can take a big deep breath as result of the pain.  Denies any palpitations, chest pain, nausea, vomiting.  She has been taking Tylenol and Aleve without improvement of symptoms.    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   History of shingles 06/08/2015   Adjustment disorder with mixed anxiety and depressed mood 06/03/2014   Essential hypertension 06/03/2014   Gastroesophageal reflux disease without esophagitis 06/03/2014   Diarrhea 06/03/2014   Sleep disturbance 06/03/2014   Hyperlipidemia 06/03/2014    Past Surgical History:  Procedure Laterality Date   BREAST REDUCTION SURGERY Bilateral 1993   BREAST SURGERY Bilateral     15 years ago   COSMETIC SURGERY     breast reduction   FRACTURE SURGERY Left    left arm and shoulder   OTHER SURGICAL HISTORY     blephmospasms-eyes   REDUCTION MAMMAPLASTY      OB History   No obstetric history on file.      Home Medications    Prior to Admission medications   Medication Sig Start Date End Date Taking? Authorizing Provider  diazepam (VALIUM) 5 MG tablet Take by mouth. 06/06/17  Yes [provider]  lidocaine (LIDODERM) 5 % Place 1 patch onto the skin daily. Remove & Discard patch within 12 hours or as directed by MD 06/04/23  Yes Kalab Camps, Denny Peon K, PA-C  metoprolol tartrate (LOPRESSOR) 25 MG tablet Take by mouth. 04/24/11  Yes [provider]  traMADol (ULTRAM) 50 MG tablet Take 1 tablet (50 mg total) by mouth every 12 (twelve) hours as needed for up to 3 days. Do not take with Ativan 06/04/23 06/07/23 Yes Halina Asano K, PA-C  aspirin 325 MG tablet Take 1 tablet (325 mg total) by mouth daily. Patient not taking: Reported on 06/04/2023 07/14/17   Zigmund Daniel., MD  botulinum toxin Type A (BOTOX) 100 units SOLR injection Inject 33.5 Units as directed every 3 (three) months. 08/08/16   [provider]  buPROPion (WELLBUTRIN XL) 150 MG 24 hr tablet TAKE 1 TABLET (150 MG TOTAL) BY MOUTH  DAILY. 12/16/16   Ofilia Neas, PA-C  calcium carbonate (TUMS - DOSED IN MG ELEMENTAL CALCIUM) 500 MG chewable tablet Chew 2 tablets by mouth at bedtime.     [provider]  cetirizine (ZYRTEC) 10 MG tablet Take 10 mg by mouth daily as needed for allergies.    [provider]  diazepam (VALIUM) 5 MG tablet Take one at night and one additional in daytime if needed for anxiety and panic Patient taking differently: Take 5 mg by mouth daily. Take one at night and one additional in daytime if needed for anxiety and panic 07/05/16   Elvina Sidle, MD  loperamide (IMODIUM) 2 MG capsule Take 10 mg by mouth daily as needed for diarrhea or loose  stools.     [provider]  metoprolol tartrate (LOPRESSOR) 25 MG tablet TAKE 1 TABLET (25 MG TOTAL) BY MOUTH DAILY. Patient taking differently: Take 25 mg by mouth at bedtime.  12/14/16   Porfirio Oar, PA  Multiple Vitamins-Minerals (DRY EYE FORMULA PO) Place 2 drops into both eyes as needed.    [provider]  omeprazole (PRILOSEC) 20 MG capsule Take 20 mg by mouth as needed.     [provider]  pravastatin (PRAVACHOL) 40 MG tablet Take 1 tablet (40 mg total) by mouth daily at 6 PM. 07/13/17   Zigmund Daniel., MD  ramipril (ALTACE) 5 MG capsule TAKE 1 CAPSULE (5 MG TOTAL) BY MOUTH DAILY. 12/14/16   Porfirio Oar, PA    Family History Family History  Problem Relation Age of Onset   Cancer Mother    Cancer Father    Heart disease Brother    Hypertension Brother    Hyperlipidemia Brother    Hypertension Brother    Cancer Daughter    Hypertension Daughter    Breast cancer Neg Hx     Social History Social History   Tobacco Use   Smoking status: Never   Smokeless tobacco: Never  Vaping Use   Vaping status: Never Used  Substance Use Topics   Alcohol use: Yes    Alcohol/week: 5.0 standard drinks of alcohol    Types: 5 Standard drinks or equivalent per week    Comment: occasionally drinks wine   Drug use: No     Allergies   Pollen extract   Review of Systems Review of Systems  Constitutional:  Positive for activity change. Negative for appetite change, fatigue and fever.  Respiratory:  Negative for cough and shortness of breath.   Cardiovascular:  Positive for chest pain (rib pain).  Gastrointestinal:  Negative for abdominal pain, diarrhea, nausea and vomiting.  Musculoskeletal:  Positive for back pain. Negative for arthralgias and myalgias.     Physical Exam Triage Vital Signs ED Triage Vitals [06/04/23 0857]  Encounter Vitals Group     BP 137/60     Systolic BP Percentile      Diastolic BP Percentile      Pulse      Resp  20     Temp 97.7 F (36.5 C)     Temp Source Oral     SpO2 96 %     Weight      Height      Head Circumference      Peak Flow      Pain Score      Pain Loc      Pain Education      Exclude from Growth Chart    No data found.  Updated Vital Signs BP 137/60 (BP Location: Left Arm)   Pulse 60   Temp 97.7 F (36.5 C) (Oral)   Resp 20   SpO2 96%   Visual Acuity Right Eye Distance:   Left Eye Distance:   Bilateral Distance:    Right Eye Near:   Left Eye Near:    Bilateral Near:     Physical Exam Vitals reviewed.  Constitutional:      General: She is awake. She is not in acute distress.    Appearance: Normal appearance. She is well-developed. She is not ill-appearing.     Comments: Very pleasant female appears stated age in no acute distress sitting comfortably in exam room  HENT:     Head: Normocephalic and atraumatic.  Cardiovascular:     Rate and Rhythm: Normal rate and regular rhythm.     Heart sounds: Normal heart sounds, S1 normal and S2 normal. No murmur heard. Pulmonary:     Effort: Pulmonary effort is normal.     Breath sounds: Normal breath sounds. No wheezing, rhonchi or rales.     Comments: Clear to auscultation bilaterally Chest:     Chest wall: Tenderness present. No deformity or swelling.     Comments: Significant tenderness to palpation with any small movement or palpation of lateral/posterior left ribs.  No deformity noted. Abdominal:     General: Bowel sounds are normal.     Palpations: Abdomen is soft.     Tenderness: There is no abdominal tenderness. There is no right CVA tenderness, left CVA tenderness, guarding or rebound.  Musculoskeletal:     Comments: Normal active range of motion of right arm/shoulder.  No pain percussion of vertebrae of cervical or thoracic spine.  No deformity or step-off noted.  Psychiatric:        Behavior: Behavior is cooperative.      UC Treatments / Results  Labs (all labs ordered are listed, but only  abnormal results are displayed) Labs Reviewed - No data to display  EKG   Radiology DG Ribs Unilateral W/Chest Left  Result Date: 06/04/2023 CLINICAL DATA:  Pain after fall 1 week ago. EXAM: LEFT RIBS AND CHEST - 3+ VIEW COMPARISON:  None Available. FINDINGS: Lateral left rib fractures noted involving at least the lateral 6th and 7th ribs. No effusions or pneumothorax. Heart and mediastinal contours within normal limits. Aortic atherosclerosis. IMPRESSION: Left lateral rib fractures seen involving at least ribs 6 and 7. No effusion or pneumothorax. Electronically Signed   By: Charlett Nose M.D.   On: 06/04/2023 10:46    Procedures Procedures (including critical care time)  Medications Ordered in UC Medications - No data to display  Initial Impression / Assessment and Plan / UC Course  I have reviewed the triage vital signs and the nursing notes.  Pertinent labs & imaging results that were available during my care of the patient were reviewed by me and considered in my medical decision making (see chart for details).     Patient is well-appearing, afebrile, nontoxic, nontachycardic.  X-ray was obtained and while waiting for the x-ray read from radiology patient became claustrophobic and felt like she can no longer sit in our clinic.  Agreed to contact her with these results.  Called her by phone once the read was available and discussed 6 and seventh rib fractures.  She was already prescribed lidocaine patches and can use these for pain relief but will also add tramadol.  Review of [place] controlled substance database shows  no inappropriate refills.  She does have a prescription for Ativan and we discussed that these medication should not be used together as it increases the risk for falling and sedation to which she expressed understanding.  We discussed the importance of pulmonary hygiene to prevent atelectasis and she was encouraged to pick up a incentive spirometer from either  [employer], pharmacy, medical supply store.  Recommended she contact her surgeon and discuss her broken ribs to determine if they would like to postpone her surgery.  She is to follow-up closely with her primary care.  Discussed that if she has any worsening or changing symptoms including increasing pain, shortness of breath, fever, severe cough she should be seen immediately.  Strict return precautions given.  All questions answered to patient satisfaction.  Final Clinical Impressions(s) / UC Diagnoses   Final diagnoses:  Fall, initial encounter  Closed fracture of multiple ribs of left side, initial encounter     Discharge Instructions      I am still waiting on your results.  I will contact you if they show a broken rib.  Use lidocaine patches to help with your pain.  You can continue over-the-counter medications including Tylenol and ibuprofen.  Follow-up with your primary care soon as possible.  If anything worsens you have increasing pain, difficulty breathing, severe cough you need to be seen immediately.     ED Prescriptions     Medication Sig Dispense Auth. Provider   lidocaine (LIDODERM) 5 % Place 1 patch onto the skin daily. Remove & Discard patch within 12 hours or as directed by MD 30 patch Blake Goya K, PA-C   traMADol (ULTRAM) 50 MG tablet Take 1 tablet (50 mg total) by mouth every 12 (twelve) hours as needed for up to 3 days. Do not take with Ativan 6 tablet Keymarion Bearman K, PA-C      I have reviewed the PDMP during this encounter.   Jeani Hawking, PA-C 06/04/23 1101

## 2023-06-04 NOTE — ED Notes (Signed)
Patient is back in hallway, leaving

## 2023-06-04 NOTE — ED Notes (Signed)
Have instructed patient to remain in chair.  Provided patient multiple warm blankets

## 2023-06-04 NOTE — ED Triage Notes (Signed)
7/11 patient had a fall.  Patient reports she is going to have surgery on lower back this coming Monday.  Patient reports leaning forward to move her dog out of the way and pain shot down left leg and caused her to fall.  Patient reports several falls recently-all involved her dog.    No additional medicine taken.    Reports hitting her head, left torso/ribs.  Denies a bruise, reports old shingles scar interferes with determining if a bruise per patient.  Touches right/back of head where she hit her head.  Denies loc.  Patient did go immediately to a scheduled appt for pre surgery appt.  They were made aware of fall.  Yesterday called dr Shirlean Mylar office to question if she could still have surgery or not.  Office said good for surgery, but get checked out

## 2023-06-04 NOTE — ED Notes (Addendum)
Patient roaming in halls, patient hears other patients conversations and wanting to leave.  Concerned for illness of others. Encouraged patient to wait in treatment room  Notified [place], [place]

## 2023-07-07 NOTE — Pre-Procedure Instructions (Signed)
Surgical Instructions    Your procedure is scheduled on July 14, 2023.  Report to [facility] Main Entrance "A" at 6:00 A.M., then check in with the Admitting office.  Call this number if you have problems the morning of surgery:  [phone]   If you have any questions prior to your surgery date call [phone]: Open Monday-Friday 8am-4pm If you experience any cold or flu symptoms such as cough, fever, chills, shortness of breath, etc. between now and your scheduled surgery, please notify us at the above number     Remember:  Do not eat or drink after midnight the night before your surgery      Take these medicines the morning of surgery with A SIP OF WATER:  metoprolol tartrate (LOPRESSOR)   If needed: acetaminophen (TYLENOL)  cetirizine (ZYRTEC) diazepam (VALIUM)  meclizine (ANTIVERT)  omeprazole (PRILOSEC)  tetrahydrozoline-zinc (VISINE-AC) 0.05-0.25 % ophthalmic solution   As of today, STOP taking any Aspirin (unless otherwise instructed by your surgeon) Aleve, Naproxen, Ibuprofen, Motrin, Advil, Goody's, BC's, all herbal medications, fish oil, and all vitamins.            [facility] is not responsible for any belongings or valuables.    Do NOT Smoke (Tobacco/Vaping)  24 hours prior to your procedure  If you use a CPAP at night, you may bring your mask for your overnight stay.   Contacts, glasses, hearing aids, dentures or partials may not be worn into surgery, please bring cases for these belongings   For patients admitted to the hospital, discharge time will be determined by your treatment team.   Patients discharged the day of surgery will not be allowed to drive home, and someone needs to stay with them for 24 hours.   SURGICAL WAITING ROOM VISITATION Patients having surgery or a procedure may have no more than 2 support people in the waiting area - these visitors may rotate.   Children under the age of 72 must have an adult with them who is not the  patient. If the patient needs to stay at the hospital during part of their recovery, the visitor guidelines for inpatient rooms apply. Pre-op nurse will coordinate an appropriate time for 1 support person to accompany patient in pre-op.  This support person may not rotate.   Please refer to [email or web address] for the visitor guidelines for Inpatients (after your surgery is over and you are in a regular room).    Special instructions:    Oral Hygiene is also important to reduce your risk of infection.  Remember - BRUSH YOUR TEETH THE MORNING OF SURGERY WITH YOUR REGULAR TOOTHPASTE     Pre-operative 5 CHG Bath Instructions   You can play a key role in reducing the risk of infection after surgery. Your skin needs to be as free of germs as possible. You can reduce the number of germs on your skin by washing with CHG (chlorhexidine gluconate) soap before surgery. CHG is an antiseptic soap that kills germs and continues to kill germs even after washing.   DO NOT use if you have an allergy to chlorhexidine/CHG or antibacterial soaps. If your skin becomes reddened or irritated, stop using the CHG and notify one of our RNs at [phone].   Please shower with the CHG soap starting 4 days before surgery using the following schedule:     Please keep in mind the following:  DO NOT shave, including legs and underarms, starting the day of your first shower.   You  may shave your face at any point before/day of surgery.  Place clean sheets on your bed the day you start using CHG soap. Use a clean washcloth (not used since being washed) for each shower. DO NOT sleep with pets once you start using the CHG.   CHG Shower Instructions:  If you choose to wash your hair and private area, wash first with your normal shampoo/soap.  After you use shampoo/soap, rinse your hair and body thoroughly to remove shampoo/soap residue.  Turn the water OFF and apply  about 3 tablespoons (45 ml) of CHG soap to a CLEAN washcloth.  Apply CHG soap ONLY FROM YOUR NECK DOWN TO YOUR TOES (washing for 3-5 minutes)  DO NOT use CHG soap on face, private areas, open wounds, or sores.  Pay special attention to the area where your surgery is being performed.  If you are having back surgery, having someone wash your back for you may be helpful. Wait 2 minutes after CHG soap is applied, then you may rinse off the CHG soap.  Pat dry with a clean towel  Put on clean clothes/pajamas   If you choose to wear lotion, please use ONLY the CHG-compatible lotions on the back of this paper.     Additional instructions for the day of surgery: DO NOT APPLY any lotions, deodorants, cologne, or perfumes.   Put on clean/comfortable clothes.  Brush your teeth.  Ask your nurse before applying any prescription medications to the skin. Do not wear jewelry or makeup. Men may shave face and neck. Do not bring valuables to the hospital. Do not wear nail polish, gel polish, artificial nails, or any other type of covering on natural nails (fingers and toes) If you have artificial nails or gel coating that need to be removed by a nail salon, please have this removed prior to surgery. Artificial nails or gel coating may interfere with anesthesia's ability to adequately monitor your vital signs.     CHG Compatible Lotions   Aveeno Moisturizing lotion  Cetaphil Moisturizing Cream  Cetaphil Moisturizing Lotion  Clairol Herbal Essence Moisturizing Lotion, Dry Skin  Clairol Herbal Essence Moisturizing Lotion, Extra Dry Skin  Clairol Herbal Essence Moisturizing Lotion, Normal Skin  Curel Age Defying Therapeutic Moisturizing Lotion with Alpha Hydroxy  Curel Extreme Care Body Lotion  Curel Soothing Hands Moisturizing Hand Lotion  Curel Therapeutic Moisturizing Cream, Fragrance-Free  Curel Therapeutic Moisturizing Lotion, Fragrance-Free  Curel Therapeutic Moisturizing Lotion, Original  Formula  Eucerin Daily Replenishing Lotion  Eucerin Dry Skin Therapy Plus Alpha Hydroxy Crme  Eucerin Dry Skin Therapy Plus Alpha Hydroxy Lotion  Eucerin Original Crme  Eucerin Original Lotion  Eucerin Plus Crme Eucerin Plus Lotion  Eucerin TriLipid Replenishing Lotion  Keri Anti-Bacterial Hand Lotion  Keri Deep Conditioning Original Lotion Dry Skin Formula Softly Scented  Keri Deep Conditioning Original Lotion, Fragrance Free Sensitive Skin Formula  Keri Lotion Fast Absorbing Fragrance Free Sensitive Skin Formula  Keri Lotion Fast Absorbing Softly Scented Dry Skin Formula  Keri Original Lotion  Keri Skin Renewal Lotion Keri Silky Smooth Lotion  Keri Silky Smooth Sensitive Skin Lotion  Nivea Body Creamy Conditioning Oil  Nivea Body Extra Enriched Teacher, adult education Moisturizing Lotion Nivea Crme  Nivea Skin Firming Lotion  NutraDerm 30 Skin Lotion  NutraDerm Skin Lotion  NutraDerm Therapeutic Skin Cream  NutraDerm Therapeutic Skin Lotion  ProShield Protective Hand Cream  Provon moisturizing lotion     If you received a COVID test  during your pre-op visit, it is requested that you wear a mask when out in public, stay away from anyone that may not be feeling well, and notify your surgeon if you develop symptoms. If you have been in contact with anyone that has tested positive in the last 10 days, please notify your surgeon.    Please read over the following fact sheets that you were given.

## 2023-07-08 ENCOUNTER — Other Ambulatory Visit: Payer: Self-pay

## 2023-07-08 ENCOUNTER — Encounter (HOSPITAL_COMMUNITY): Payer: Self-pay

## 2023-07-08 ENCOUNTER — Inpatient Hospital Stay (HOSPITAL_COMMUNITY): Admission: RE | Admit: 2023-07-08 | Payer: Medicare Other | Source: Ambulatory Visit

## 2023-07-08 VITALS — BP 156/61 | HR 62 | Temp 98.1°F | Resp 18 | Ht 62.0 in | Wt 136.2 lb

## 2023-07-08 DIAGNOSIS — Z01818 Encounter for other preprocedural examination: Secondary | ICD-10-CM

## 2023-07-08 DIAGNOSIS — I498 Other specified cardiac arrhythmias: Secondary | ICD-10-CM | POA: Diagnosis not present

## 2023-07-08 DIAGNOSIS — I10 Essential (primary) hypertension: Secondary | ICD-10-CM | POA: Diagnosis not present

## 2023-07-08 HISTORY — DX: Family history of other specified conditions: Z84.89

## 2023-07-08 LAB — CBC
HCT: 42.5 % (ref 36.0–46.0)
Hemoglobin: 14.4 g/dL (ref 12.0–15.0)
MCH: 31.3 pg (ref 26.0–34.0)
MCHC: 33.9 g/dL (ref 30.0–36.0)
MCV: 92.4 fL (ref 80.0–100.0)
Platelets: 228 10*3/uL (ref 150–400)
RBC: 4.6 MIL/uL (ref 3.87–5.11)
RDW: 11.9 % (ref 11.5–15.5)
WBC: 7.1 10*3/uL (ref 4.0–10.5)
nRBC: 0 % (ref 0.0–0.2)

## 2023-07-08 LAB — BASIC METABOLIC PANEL
Anion gap: 13 (ref 5–15)
BUN: 17 mg/dL (ref 8–23)
CO2: 28 mmol/L (ref 22–32)
Calcium: 9.2 mg/dL (ref 8.9–10.3)
Chloride: 97 mmol/L — ABNORMAL LOW (ref 98–111)
Creatinine, Ser: 0.77 mg/dL (ref 0.44–1.00)
GFR, Estimated: >60 mL/min (ref >60)
Glucose, Bld: 111 mg/dL — ABNORMAL HIGH (ref 70–99)
Potassium: 3.6 mmol/L (ref 3.5–5.1)
Sodium: 138 mmol/L (ref 135–145)

## 2023-07-08 LAB — SURGICAL PCR SCREEN
MRSA, PCR: NEGATIVE
Staphylococcus aureus: NEGATIVE

## 2023-07-08 NOTE — Progress Notes (Signed)
PCP - Tammy Eartha Inch MD Cardiologist - Denies  PPM/ICD - N/A  Chest x-ray - N/I EKG - 07/08/23 Stress Test - Yes 3-4 years in [facility] ECHO - 07/13/17 Cardiac Cath - Denies  Sleep Study - Denies  DM - Denies  Blood Thinner Instructions: N/A Aspirin Instructions:N/A   COVID TEST- N/A   Anesthesia review: No  Patient denies shortness of breath, fever, cough and chest pain at PAT appointment   All instructions explained to the patient, with a verbal understanding of the material. Patient agrees to go over the instructions while at home for a better understanding.  The opportunity to ask questions was provided.

## 2023-07-11 NOTE — Progress Notes (Signed)
patient voiced understanding of new arrival time of 0700 on monday

## 2023-07-13 NOTE — Anesthesia Preprocedure Evaluation (Signed)
Anesthesia Evaluation  Patient identified by MRN, date of birth, ID band Patient awake    Reviewed: Allergy & Precautions, NPO status , Patient's Chart, lab work & pertinent test results, Unable to perform ROS - Chart review only  History of Anesthesia Complications Negative for: history of anesthetic complications  Airway Mallampati: II  TM Distance: >3 FB Neck ROM: Full    Dental  (+) Teeth Intact, Dental Advisory Given   Pulmonary neg pulmonary ROS   breath sounds clear to auscultation       Cardiovascular hypertension, Pt. on medications and Pt. on home beta blockers (-) angina (-) Past MI and (-) CHF  Rhythm:Regular  Echo 2018  - Left ventricle: The cavity size was normal. Systolic function was normal. The estimated ejection fraction was in the range of 60% to 65%. Wall motion was normal; there were no regional wall motion abnormalities. There was an increased relative  contribution of atrial contraction to ventricular filling. Doppler parameters are consistent with abnormal left ventricular relaxation (grade 1 diastolic dysfunction).  - Aortic valve: Poorly visualized.  - Mitral valve: Valve area by pressure half-time: 1.35 cm^2.  - Pulmonary arteries: Systolic pressure could not be accurately estimated.     Neuro/Psych  PSYCHIATRIC DISORDERS Anxiety     TIA Neuromuscular disease CVA, No Residual Symptoms    GI/Hepatic Neg liver ROS,GERD  ,,  Endo/Other  negative endocrine ROS    Renal/GU negative Renal ROS     Musculoskeletal negative musculoskeletal ROS (+)    Abdominal   Peds  Hematology negative hematology ROS (+)   Anesthesia Other Findings   Reproductive/Obstetrics                             Anesthesia Physical Anesthesia Plan  ASA: 3  Anesthesia Plan: General   Post-op Pain Management: Ofirmev IV (intra-op)*   Induction: Intravenous  PONV Risk Score and Plan: 4 or  greater and Ondansetron, Dexamethasone and Treatment may vary due to age or medical condition  Airway Management Planned: Oral ETT  Additional Equipment: None  Intra-op Plan:   Post-operative Plan: Extubation in OR  Informed Consent: I have reviewed the patients History and Physical, chart, labs and discussed the procedure including the risks, benefits and alternatives for the proposed anesthesia with the patient or authorized representative who has indicated his/her understanding and acceptance.     Dental advisory given  Plan Discussed with: CRNA  Anesthesia Plan Comments:        Anesthesia Quick Evaluation

## 2023-07-14 ENCOUNTER — Encounter (HOSPITAL_COMMUNITY): Payer: Self-pay | Admitting: Neurosurgery

## 2023-07-14 ENCOUNTER — Observation Stay (HOSPITAL_COMMUNITY)
Admission: RE | Admit: 2023-07-14 | Discharge: 2023-07-15 | Disposition: A | Discharge status: Home / Self Care | Payer: Medicare Other | Source: Ambulatory Visit | Attending: Neurosurgery | Admitting: Neurosurgery

## 2023-07-14 ENCOUNTER — Ambulatory Visit (HOSPITAL_COMMUNITY): Payer: Medicare Other | Admitting: Physician Assistant

## 2023-07-14 ENCOUNTER — Ambulatory Visit (HOSPITAL_COMMUNITY): Payer: Medicare Other

## 2023-07-14 ENCOUNTER — Encounter (HOSPITAL_COMMUNITY)
Admission: RE | Disposition: A | Discharge status: Home / Self Care | Payer: Self-pay | Source: Ambulatory Visit | Attending: Neurosurgery

## 2023-07-14 ENCOUNTER — Ambulatory Visit (HOSPITAL_BASED_OUTPATIENT_CLINIC_OR_DEPARTMENT_OTHER): Payer: Medicare Other | Admitting: Anesthesiology

## 2023-07-14 ENCOUNTER — Other Ambulatory Visit: Payer: Self-pay

## 2023-07-14 DIAGNOSIS — I10 Essential (primary) hypertension: Secondary | ICD-10-CM

## 2023-07-14 DIAGNOSIS — E785 Hyperlipidemia, unspecified: Secondary | ICD-10-CM

## 2023-07-14 DIAGNOSIS — F418 Other specified anxiety disorders: Secondary | ICD-10-CM | POA: Diagnosis not present

## 2023-07-14 DIAGNOSIS — M48061 Spinal stenosis, lumbar region without neurogenic claudication: Secondary | ICD-10-CM | POA: Diagnosis not present

## 2023-07-14 DIAGNOSIS — Z8673 Personal history of transient ischemic attack (TIA), and cerebral infarction without residual deficits: Secondary | ICD-10-CM | POA: Diagnosis not present

## 2023-07-14 DIAGNOSIS — M48062 Spinal stenosis, lumbar region with neurogenic claudication: Secondary | ICD-10-CM | POA: Diagnosis present

## 2023-07-14 DIAGNOSIS — Z79899 Other long term (current) drug therapy: Secondary | ICD-10-CM | POA: Insufficient documentation

## 2023-07-14 DIAGNOSIS — M5416 Radiculopathy, lumbar region: Principal | ICD-10-CM | POA: Insufficient documentation

## 2023-07-14 HISTORY — PX: LUMBAR LAMINECTOMY/DECOMPRESSION MICRODISCECTOMY: SHX5026

## 2023-07-14 SURGERY — LUMBAR LAMINECTOMY/DECOMPRESSION MICRODISCECTOMY 1 LEVEL
Anesthesia: General | Site: Back | Laterality: Bilateral

## 2023-07-14 MED ORDER — ONDANSETRON HCL 4 MG/2ML IJ SOLN
INTRAMUSCULAR | Status: AC
  Filled 2023-07-14: qty 2

## 2023-07-14 MED ORDER — NAPHAZOLINE-GLYCERIN 0.012-0.25 % OP SOLN: 1.0000 [drp] | Freq: Four times a day (QID) | OPHTHALMIC | Status: DC | PRN

## 2023-07-14 MED ORDER — FENTANYL CITRATE (PF) 250 MCG/5ML IJ SOLN
INTRAMUSCULAR | Status: AC
  Filled 2023-07-14: qty 5

## 2023-07-14 MED ORDER — 0.9 % SODIUM CHLORIDE (POUR BTL) OPTIME
TOPICAL | Status: DC | PRN
  Administered 2023-07-14: 1000 mL

## 2023-07-14 MED ORDER — ONDANSETRON HCL 4 MG/2ML IJ SOLN: 4.0000 mg | Freq: Four times a day (QID) | INTRAMUSCULAR | Status: DC | PRN

## 2023-07-14 MED ORDER — LACTATED RINGERS IV SOLN: INTRAVENOUS | Status: DC

## 2023-07-14 MED ORDER — MENTHOL 3 MG MT LOZG
1.0000 | LOZENGE | OROMUCOSAL | Status: DC | PRN
  Administered 2023-07-14: 3 mg via ORAL
  Filled 2023-07-14: qty 9

## 2023-07-14 MED ORDER — ACETAMINOPHEN 160 MG/5ML PO SOLN: 1000.0000 mg | Freq: Once | ORAL | Status: DC | PRN

## 2023-07-14 MED ORDER — OXYCODONE HCL 5 MG/5ML PO SOLN
ORAL | Status: AC
  Filled 2023-07-14: qty 5

## 2023-07-14 MED ORDER — CEFAZOLIN SODIUM-DEXTROSE 2-4 GM/100ML-% IV SOLN
2.0000 g | INTRAVENOUS | Status: AC
  Administered 2023-07-14: 2 g via INTRAVENOUS
  Filled 2023-07-14: qty 100

## 2023-07-14 MED ORDER — OXYCODONE HCL 5 MG/5ML PO SOLN
5.0000 mg | Freq: Once | ORAL | Status: AC | PRN
  Administered 2023-07-14: 5 mg via ORAL

## 2023-07-14 MED ORDER — DIAZEPAM 5 MG PO TABS
ORAL_TABLET | ORAL | Status: AC
  Filled 2023-07-14: qty 1

## 2023-07-14 MED ORDER — SUCCINYLCHOLINE CHLORIDE 200 MG/10ML IV SOSY
PREFILLED_SYRINGE | INTRAVENOUS | Status: AC
  Filled 2023-07-14: qty 10

## 2023-07-14 MED ORDER — HYDROCODONE-ACETAMINOPHEN 10-325 MG PO TABS: 2.0000 | ORAL_TABLET | ORAL | Status: DC | PRN

## 2023-07-14 MED ORDER — HYDROMORPHONE HCL 1 MG/ML IJ SOLN: 1.0000 mg | INTRAMUSCULAR | Status: DC | PRN

## 2023-07-14 MED ORDER — ACETAMINOPHEN 325 MG PO TABS: 650.0000 mg | ORAL_TABLET | ORAL | Status: DC | PRN

## 2023-07-14 MED ORDER — BUPROPION HCL ER (XL) 150 MG PO TB24
150.0000 mg | ORAL_TABLET | Freq: Every evening | ORAL | Status: DC
  Administered 2023-07-14: 150 mg via ORAL
  Filled 2023-07-14: qty 1

## 2023-07-14 MED ORDER — ONDANSETRON HCL 4 MG/2ML IJ SOLN
INTRAMUSCULAR | Status: DC | PRN
  Administered 2023-07-14: 4 mg via INTRAVENOUS

## 2023-07-14 MED ORDER — CHLORHEXIDINE GLUCONATE CLOTH 2 % EX PADS: 6.0000 | MEDICATED_PAD | Freq: Once | CUTANEOUS | Status: DC

## 2023-07-14 MED ORDER — LORATADINE 10 MG PO TABS
10.0000 mg | ORAL_TABLET | Freq: Every day | ORAL | Status: DC
  Administered 2023-07-14: 10 mg via ORAL
  Filled 2023-07-14: qty 1

## 2023-07-14 MED ORDER — PROPOFOL 10 MG/ML IV BOLUS
INTRAVENOUS | Status: AC
  Filled 2023-07-14: qty 40

## 2023-07-14 MED ORDER — ORAL CARE MOUTH RINSE: 15.0000 mL | Freq: Once | OROMUCOSAL | Status: AC

## 2023-07-14 MED ORDER — ACETAMINOPHEN 500 MG PO TABS: 1000.0000 mg | ORAL_TABLET | Freq: Once | ORAL | Status: DC | PRN

## 2023-07-14 MED ORDER — KETOROLAC TROMETHAMINE 15 MG/ML IJ SOLN
15.0000 mg | Freq: Four times a day (QID) | INTRAMUSCULAR | Status: AC
  Administered 2023-07-14 – 2023-07-15 (×4): 15 mg via INTRAVENOUS
  Filled 2023-07-14 (×4): qty 1

## 2023-07-14 MED ORDER — GLYCOPYRROLATE 0.2 MG/ML IJ SOLN
INTRAMUSCULAR | Status: DC | PRN
  Administered 2023-07-14: .2 mg via INTRAVENOUS

## 2023-07-14 MED ORDER — PHENYLEPHRINE HCL-NACL 20-0.9 MG/250ML-% IV SOLN
INTRAVENOUS | Status: DC | PRN
  Administered 2023-07-14: 30 ug/min via INTRAVENOUS

## 2023-07-14 MED ORDER — SODIUM CHLORIDE 0.9% FLUSH: 3.0000 mL | INTRAVENOUS | Status: DC | PRN

## 2023-07-14 MED ORDER — ONDANSETRON HCL 4 MG PO TABS: 4.0000 mg | ORAL_TABLET | Freq: Four times a day (QID) | ORAL | Status: DC | PRN

## 2023-07-14 MED ORDER — ACETAMINOPHEN 10 MG/ML IV SOLN
1000.0000 mg | Freq: Once | INTRAVENOUS | Status: AC
  Administered 2023-07-14: 1000 mg via INTRAVENOUS

## 2023-07-14 MED ORDER — THROMBIN 20000 UNITS EX SOLR
CUTANEOUS | Status: AC
  Filled 2023-07-14: qty 20000

## 2023-07-14 MED ORDER — ROCURONIUM BROMIDE 10 MG/ML (PF) SYRINGE
PREFILLED_SYRINGE | INTRAVENOUS | Status: DC | PRN
  Administered 2023-07-14: 50 mg via INTRAVENOUS

## 2023-07-14 MED ORDER — ACETAMINOPHEN 10 MG/ML IV SOLN
INTRAVENOUS | Status: AC
  Filled 2023-07-14: qty 100

## 2023-07-14 MED ORDER — DEXAMETHASONE SODIUM PHOSPHATE 10 MG/ML IJ SOLN
INTRAMUSCULAR | Status: AC
  Filled 2023-07-14: qty 1

## 2023-07-14 MED ORDER — EPHEDRINE SULFATE-NACL 50-0.9 MG/10ML-% IV SOSY
PREFILLED_SYRINGE | INTRAVENOUS | Status: DC | PRN
  Administered 2023-07-14: 10 mg via INTRAVENOUS
  Administered 2023-07-14: 5 mg via INTRAVENOUS

## 2023-07-14 MED ORDER — FENTANYL CITRATE (PF) 250 MCG/5ML IJ SOLN
INTRAMUSCULAR | Status: DC | PRN
  Administered 2023-07-14 (×4): 50 ug via INTRAVENOUS

## 2023-07-14 MED ORDER — HYDROCHLOROTHIAZIDE 25 MG PO TABS: 25.0000 mg | ORAL_TABLET | Freq: Every day | ORAL | Status: DC

## 2023-07-14 MED ORDER — SUGAMMADEX SODIUM 200 MG/2ML IV SOLN
INTRAVENOUS | Status: DC | PRN
  Administered 2023-07-14: 200 mg via INTRAVENOUS
  Administered 2023-07-14: 50 mg via INTRAVENOUS

## 2023-07-14 MED ORDER — PANTOPRAZOLE SODIUM 40 MG PO TBEC
40.0000 mg | DELAYED_RELEASE_TABLET | Freq: Every day | ORAL | Status: DC
  Administered 2023-07-14: 40 mg via ORAL
  Filled 2023-07-14: qty 1

## 2023-07-14 MED ORDER — HYDROCODONE-ACETAMINOPHEN 10-325 MG PO TABS: 1.0000 | ORAL_TABLET | ORAL | Status: DC | PRN

## 2023-07-14 MED ORDER — CYCLOBENZAPRINE HCL 10 MG PO TABS
10.0000 mg | ORAL_TABLET | Freq: Three times a day (TID) | ORAL | Status: DC | PRN
  Administered 2023-07-14: 10 mg via ORAL
  Filled 2023-07-14: qty 1

## 2023-07-14 MED ORDER — ARTIFICIAL TEARS OPHTHALMIC OINT: 1.0000 | TOPICAL_OINTMENT | Freq: Every day | OPHTHALMIC | Status: DC | PRN

## 2023-07-14 MED ORDER — PHENOL 1.4 % MT LIQD: 1.0000 | OROMUCOSAL | Status: DC | PRN

## 2023-07-14 MED ORDER — THROMBIN 20000 UNITS EX SOLR
CUTANEOUS | Status: DC | PRN
  Administered 2023-07-14: 20 mL via TOPICAL

## 2023-07-14 MED ORDER — SODIUM CHLORIDE 0.9 % IV SOLN
250.0000 mL | INTRAVENOUS | Status: DC
  Administered 2023-07-14: 250 mL via INTRAVENOUS

## 2023-07-14 MED ORDER — CHLORHEXIDINE GLUCONATE 0.12 % MT SOLN
15.0000 mL | Freq: Once | OROMUCOSAL | Status: AC
  Administered 2023-07-14: 15 mL via OROMUCOSAL
  Filled 2023-07-14: qty 15

## 2023-07-14 MED ORDER — BUPIVACAINE HCL (PF) 0.5 % IJ SOLN
INTRAMUSCULAR | Status: DC | PRN
Start: 2023-07-14 — End: 2023-07-14
  Administered 2023-07-14: 20 mL

## 2023-07-14 MED ORDER — MECLIZINE HCL 12.5 MG PO TABS: 12.5000 mg | ORAL_TABLET | Freq: Three times a day (TID) | ORAL | Status: DC | PRN

## 2023-07-14 MED ORDER — PRAVASTATIN SODIUM 10 MG PO TABS
20.0000 mg | ORAL_TABLET | Freq: Every day | ORAL | Status: DC
  Administered 2023-07-14: 20 mg via ORAL
  Filled 2023-07-14: qty 2

## 2023-07-14 MED ORDER — SODIUM CHLORIDE 0.9% FLUSH: 3.0000 mL | Freq: Two times a day (BID) | INTRAVENOUS | Status: DC

## 2023-07-14 MED ORDER — FENTANYL CITRATE (PF) 100 MCG/2ML IJ SOLN
INTRAMUSCULAR | Status: AC
  Filled 2023-07-14: qty 2

## 2023-07-14 MED ORDER — CEFAZOLIN SODIUM-DEXTROSE 1-4 GM/50ML-% IV SOLN
1.0000 g | Freq: Three times a day (TID) | INTRAVENOUS | Status: AC
  Administered 2023-07-14 (×2): 1 g via INTRAVENOUS
  Filled 2023-07-14 (×2): qty 50

## 2023-07-14 MED ORDER — EPHEDRINE 5 MG/ML INJ
INTRAVENOUS | Status: AC
  Filled 2023-07-14: qty 5

## 2023-07-14 MED ORDER — DEXAMETHASONE SODIUM PHOSPHATE 10 MG/ML IJ SOLN
INTRAMUSCULAR | Status: DC | PRN
  Administered 2023-07-14: 10 mg via INTRAVENOUS

## 2023-07-14 MED ORDER — PROPOFOL 10 MG/ML IV BOLUS
INTRAVENOUS | Status: DC | PRN
Start: 2023-07-14 — End: 2023-07-14
  Administered 2023-07-14: 30 mg via INTRAVENOUS
  Administered 2023-07-14: 70 mg via INTRAVENOUS

## 2023-07-14 MED ORDER — DIAZEPAM 5 MG PO TABS
5.0000 mg | ORAL_TABLET | Freq: Two times a day (BID) | ORAL | Status: DC | PRN
  Administered 2023-07-14: 5 mg via ORAL

## 2023-07-14 MED ORDER — LOPERAMIDE HCL 2 MG PO CAPS: 2.0000 mg | ORAL_CAPSULE | ORAL | Status: DC | PRN

## 2023-07-14 MED ORDER — HYDROCODONE-ACETAMINOPHEN 5-325 MG PO TABS
1.0000 | ORAL_TABLET | ORAL | Status: DC | PRN
  Administered 2023-07-14 (×2): 1 via ORAL
  Filled 2023-07-14 (×3): qty 1

## 2023-07-14 MED ORDER — ACETAMINOPHEN 10 MG/ML IV SOLN: 1000.0000 mg | Freq: Once | INTRAVENOUS | Status: DC | PRN

## 2023-07-14 MED ORDER — LIDOCAINE 2% (20 MG/ML) 5 ML SYRINGE
INTRAMUSCULAR | Status: AC
  Filled 2023-07-14: qty 5

## 2023-07-14 MED ORDER — METOPROLOL TARTRATE 25 MG PO TABS
25.0000 mg | ORAL_TABLET | Freq: Two times a day (BID) | ORAL | Status: DC
  Administered 2023-07-14: 25 mg via ORAL
  Filled 2023-07-14: qty 1

## 2023-07-14 MED ORDER — FENTANYL CITRATE (PF) 100 MCG/2ML IJ SOLN
25.0000 ug | INTRAMUSCULAR | Status: DC | PRN
  Administered 2023-07-14 (×2): 50 ug via INTRAVENOUS

## 2023-07-14 MED ORDER — OXYCODONE HCL 5 MG PO TABS: 5.0000 mg | ORAL_TABLET | Freq: Once | ORAL | Status: AC | PRN

## 2023-07-14 MED ORDER — RAMIPRIL 5 MG PO CAPS
10.0000 mg | ORAL_CAPSULE | Freq: Every day | ORAL | Status: DC
  Filled 2023-07-14: qty 2

## 2023-07-14 MED ORDER — ROCURONIUM BROMIDE 10 MG/ML (PF) SYRINGE
PREFILLED_SYRINGE | INTRAVENOUS | Status: AC
  Filled 2023-07-14: qty 10

## 2023-07-14 MED ORDER — ACETAMINOPHEN 650 MG RE SUPP: 650.0000 mg | RECTAL | Status: DC | PRN

## 2023-07-14 MED ORDER — LIDOCAINE 2% (20 MG/ML) 5 ML SYRINGE
INTRAMUSCULAR | Status: DC | PRN
  Administered 2023-07-14: 40 mg via INTRAVENOUS

## 2023-07-14 MED ORDER — PHENYLEPHRINE 80 MCG/ML (10ML) SYRINGE FOR IV PUSH (FOR BLOOD PRESSURE SUPPORT)
PREFILLED_SYRINGE | INTRAVENOUS | Status: AC
  Filled 2023-07-14: qty 10

## 2023-07-14 SURGICAL SUPPLY — 47 items
ADH SKN CLS APL DERMABOND .7 (GAUZE/BANDAGES/DRESSINGS) ×1
APL SKNCLS STERI-STRIP NONHPOA (GAUZE/BANDAGES/DRESSINGS) ×1
BAG COUNTER SPONGE SURGICOUNT (BAG) ×2 IMPLANT
BAG DECANTER FOR FLEXI CONT (MISCELLANEOUS) ×2 IMPLANT
BAG SPNG CNTER NS LX DISP (BAG) ×1
BENZOIN TINCTURE PRP APPL 2/3 (GAUZE/BANDAGES/DRESSINGS) ×2 IMPLANT
BLADE CLIPPER SURG (BLADE) IMPLANT
BUR CUTTER 7.0 ROUND (BURR) ×2 IMPLANT
CANISTER SUCT 3000ML PPV (MISCELLANEOUS) ×2 IMPLANT
DERMABOND ADVANCED .7 DNX12 (GAUZE/BANDAGES/DRESSINGS) ×2 IMPLANT
DRAPE HALF SHEET 40X57 (DRAPES) IMPLANT
DRAPE LAPAROTOMY 100X72X124 (DRAPES) ×2 IMPLANT
DRAPE MICROSCOPE SLANT 54X150 (MISCELLANEOUS) ×2 IMPLANT
DRAPE SURG 17X23 STRL (DRAPES) ×4 IMPLANT
DRSG OPSITE POSTOP 4X6 (GAUZE/BANDAGES/DRESSINGS) IMPLANT
DURAPREP 26ML APPLICATOR (WOUND CARE) ×2 IMPLANT
ELECT REM PT RETURN 9FT ADLT (ELECTROSURGICAL) ×1
ELECTRODE REM PT RTRN 9FT ADLT (ELECTROSURGICAL) ×2 IMPLANT
GAUZE 4X4 16PLY ~~LOC~~+RFID DBL (SPONGE) IMPLANT
GAUZE SPONGE 4X4 12PLY STRL (GAUZE/BANDAGES/DRESSINGS) ×2 IMPLANT
GLOVE BIO SURGEON STRL SZ 6.5 (GLOVE) ×2 IMPLANT
GLOVE BIOGEL PI IND STRL 6.5 (GLOVE) ×2 IMPLANT
GLOVE ECLIPSE 9.0 STRL (GLOVE) ×2 IMPLANT
GLOVE EXAM NITRILE XL STR (GLOVE) IMPLANT
GOWN STRL REUS W/ TWL LRG LVL3 (GOWN DISPOSABLE) IMPLANT
GOWN STRL REUS W/ TWL XL LVL3 (GOWN DISPOSABLE) ×2 IMPLANT
GOWN STRL REUS W/TWL 2XL LVL3 (GOWN DISPOSABLE) IMPLANT
GOWN STRL REUS W/TWL LRG LVL3 (GOWN DISPOSABLE)
GOWN STRL REUS W/TWL XL LVL3 (GOWN DISPOSABLE) ×1
KIT BASIN OR (CUSTOM PROCEDURE TRAY) ×2 IMPLANT
KIT TURNOVER KIT B (KITS) ×2 IMPLANT
NDL HYPO 22X1.5 SAFETY MO (MISCELLANEOUS) ×2 IMPLANT
NDL SPNL 22GX3.5 QUINCKE BK (NEEDLE) IMPLANT
NEEDLE HYPO 22X1.5 SAFETY MO (MISCELLANEOUS) ×1 IMPLANT
NEEDLE SPNL 22GX3.5 QUINCKE BK (NEEDLE) IMPLANT
NS IRRIG 1000ML POUR BTL (IV SOLUTION) ×2 IMPLANT
PACK LAMINECTOMY NEURO (CUSTOM PROCEDURE TRAY) ×2 IMPLANT
PAD ARMBOARD 7.5X6 YLW CONV (MISCELLANEOUS) ×6 IMPLANT
SPIKE FLUID TRANSFER (MISCELLANEOUS) ×2 IMPLANT
SPONGE SURGIFOAM ABS GEL 100 (HEMOSTASIS) IMPLANT
SPONGE SURGIFOAM ABS GEL SZ50 (HEMOSTASIS) ×2 IMPLANT
STRIP CLOSURE SKIN 1/2X4 (GAUZE/BANDAGES/DRESSINGS) ×2 IMPLANT
SUT VIC AB 2-0 CT1 18 (SUTURE) ×2 IMPLANT
SUT VIC AB 3-0 SH 8-18 (SUTURE) ×2 IMPLANT
TOWEL GREEN STERILE (TOWEL DISPOSABLE) ×2 IMPLANT
TOWEL GREEN STERILE FF (TOWEL DISPOSABLE) ×2 IMPLANT
WATER STERILE IRR 1000ML POUR (IV SOLUTION) ×2 IMPLANT

## 2023-07-14 NOTE — Brief Op Note (Signed)
07/14/2023  9:52 AM  PATIENT:  Debra Roth  77 y.o. female  PRE-OPERATIVE DIAGNOSIS:  Stenosis  POST-OPERATIVE DIAGNOSIS:  Stenosis  PROCEDURE:  Procedure(s): Laminectomy and Foraminotomy - bilateral - Lumbar four-Lumbar five (Bilateral)  SURGEON:  Surgeons and Role:    Julio Sicks, MD - Primary  PHYSICIAN ASSISTANT:   ASSISTANTSMarland Mcalpine   ANESTHESIA:   general  EBL:  25 mL   BLOOD ADMINISTERED:none  DRAINS: none   LOCAL MEDICATIONS USED:  MARCAINE     SPECIMEN:  No Specimen  DISPOSITION OF SPECIMEN:  N/A  COUNTS:  YES  TOURNIQUET:  * No tourniquets in log *  DICTATION: .Dragon Dictation  PLAN OF CARE: Admit for overnight observation  PATIENT DISPOSITION:  PACU - hemodynamically stable.   Delay start of Pharmacological VTE agent (>24hrs) due to surgical blood loss or risk of bleeding: yes

## 2023-07-14 NOTE — Transfer of Care (Signed)
Immediate Anesthesia Transfer of Care Note  Patient: Debra Roth  Procedure(s) Performed: Laminectomy and Foraminotomy - bilateral - Lumbar four-Lumbar five (Bilateral: Back)  Patient Location: PACU  Anesthesia Type:General  Level of Consciousness: awake, oriented, and drowsy  Airway & Oxygen Therapy: Patient Spontanous Breathing  Post-op Assessment: Report given to RN, Post -op Vital signs reviewed and stable, and Patient moving all extremities  Post vital signs: Reviewed and stable  Last Vitals:  Vitals Value Taken Time  BP 149/106 07/14/23 1003  Temp    Pulse 57 07/14/23 1007  Resp 0 07/14/23 1007  SpO2 100 % 07/14/23 1007  Vitals shown include unfiled device data.  Last Pain:  Vitals:   07/14/23 0734  TempSrc:   PainSc: 3       Patients Stated Pain Goal: 0 (07/14/23 0734)  Complications: No notable events documented.

## 2023-07-14 NOTE — H&P (Signed)
Debra Roth is an 77 y.o. female.   Chief Complaint: Left leg pain HPI: 77 year old female with chronic and progressively worsening lower back pain with radiation into both lower extremities left greater than right.  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  Patient has failed conservative management presents now for L4-5 decompressive surgery.  Past Medical History:  Diagnosis Date   Allergy    Anxiety    Family history of adverse reaction to anesthesia    N & V sister and daughter and grandson   Hypertension    Stroke [facility]) 1990    Past Surgical History:  Procedure Laterality Date   BREAST REDUCTION SURGERY Bilateral 1993   BREAST SURGERY Bilateral    15 years ago   BUNIONECTOMY Bilateral    COSMETIC SURGERY     breast reduction   FRACTURE SURGERY Left    left arm and shoulder   OTHER SURGICAL HISTORY     blephmospasms-eyes   REDUCTION MAMMAPLASTY     ruptured disc     Dr. Wynelle Cleveland    Family History  Problem Relation Age of Onset   Cancer Mother    Cancer Father    Heart disease Brother    Hypertension Brother    Hyperlipidemia Brother    Hypertension Brother    Cancer Daughter    Hypertension Daughter    Breast cancer Neg Hx    Social History:  reports that she has never smoked. She has never used smokeless tobacco. She reports that she does not currently use alcohol. She reports that she does not use drugs.  Allergies:  Allergies  Allergen Reactions   Egg Solids, Whole Hives   Lactose Diarrhea    Medications Prior to Admission  Medication Sig Dispense Refill   acetaminophen (TYLENOL) 650 MG CR tablet Take 650-1,300 mg by mouth daily as needed for pain.     buPROPion (WELLBUTRIN XL) 150 MG 24 hr tablet TAKE 1 TABLET (150 MG TOTAL) BY MOUTH DAILY. (Patient taking differently: Take 150 mg by mouth every evening.) 30 tablet 0   cetirizine (ZYRTEC) 10 MG tablet Take 10  mg by mouth daily as needed for allergies.     diazepam (VALIUM) 5 MG tablet Take one at night and one additional in daytime if needed for anxiety and panic (Patient taking differently: Take 5 mg by mouth 2 (two) times daily as needed for anxiety.) 60 tablet 5   hydrochlorothiazide (HYDRODIURIL) 25 MG tablet Take 25 mg by mouth daily.     ibuprofen (ADVIL) 200 MG tablet Take 400 mg by mouth daily as needed for moderate pain.     loperamide (IMODIUM) 2 MG capsule Take 2-4 mg by mouth as needed for diarrhea or loose stools.     meclizine (ANTIVERT) 12.5 MG tablet Take 12.5 mg by mouth 3 (three) times daily as needed for dizziness.     metoprolol tartrate (LOPRESSOR) 25 MG tablet TAKE 1 TABLET (25 MG TOTAL) BY MOUTH DAILY. (Patient taking differently: Take 25 mg by mouth 2 (two) times daily.) 90 tablet 0   omeprazole (PRILOSEC) 20 MG capsule Take 20 mg by mouth daily as needed (acid reflux).     pravastatin (PRAVACHOL) 20 MG tablet Take 20 mg by mouth at bedtime.     ramipril (ALTACE) 10 MG capsule Take 10 mg by mouth daily.     tetrahydrozoline-zinc (VISINE-AC) 0.05-0.25 % ophthalmic solution Place 1 drop into both eyes  daily as needed (allergies).     botulinum toxin Type A (BOTOX) 100 units SOLR injection Inject 33.5 Units as directed every 3 (three) months.     lidocaine (LIDODERM) 5 % Place 1 patch onto the skin daily. Remove & Discard patch within 12 hours or as directed by MD (Patient not taking: Reported on 07/01/2023) 30 patch 0   Polyethyl Glycol-Propyl Glycol (SYSTANE OP) Place 1 drop into both eyes daily as needed (dry eyes).      No results found for this or any previous visit (from the past 48 hour(s)). No results found.  Pertinent items noted in HPI and remainder of comprehensive ROS otherwise negative.  Blood pressure (!) 145/80, pulse (!) 57, temperature 97.7 F (36.5 C), temperature source Oral, resp. rate 18, height 5\' 2"  (1.575 m), weight 61.2 kg.  Patient is awake and alert.   She is oriented and appropriate.  Speech is fluent.  Judgment insight are intact.  Cranial nerve function normal bilateral.  Motor examination feels some mild weakness of dorsiflexion on her left side grading at 4/5 remainder of her motor strength intact.  Sensory examination with decrease sensation pinprick light touch in her left L5 dermatome.  Deep tendon reflexes normal active septa her Achilles reflexes are absent bilaterally.  Gait is antalgic.  Posture is reasonably normal peer examination head ears eyes nose and throat is unremarked.  Chest and abdomen are benign.  Extremities are free from injury deformity. 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  Plan L4-5 decompressive laminectomy with foraminotomies.  Risks and benefits been explained.  Patient wishes to proceed.  Kathaleen Maser Rachid Parham 07/14/2023, 8:13 AM

## 2023-07-14 NOTE — Op Note (Signed)
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  Asst. Surgeon: Doran Durand, NP  Anesthesia: General  Indication: 77 year old female with severe lumbar degenerative disease with worsening back and bilateral lower extremity symptoms left greater than right.  Workup demonstrates evidence of severe stenosis at L4-5.  Patient has failed conservative management.  She presents now for decompressive surgery in hopes of improving her situation.  Operative note: After induction of anesthesia, patient positioned prone on the Wilson frame and properly padded.  Lumbar region prepped and draped sterilely.  Incision made overlying L4-5.  Dissection performed first on the left side.  X-ray was taken.  The L5-S1 level was exposed.  Dissection was redirected 1 level cephalad.  Dissection performed bilaterally at L4-5.  Retractor placed.  Bilateral decompressive laminotomies and foraminotomies then performed using high-speed drill and Kerrison rongeurs to remove the inferior aspect of the lamina of L4 the medial aspect the L4-5 facet joint and the superior aspect of the L5 lamina bilateral.  Ligament flavum elevated and resected.  Foraminotomies complete on the course the exiting L4 and L5 nerve roots bilaterally.  The disc space had a broad-based disc protrusion which was chronic on the right side.  The disc was reasonably hard and I do not see any value in formal discectomy as I thought this would likely be more destabilizing then beneficial.  At this point a very thorough decompression been achieved.  There was no evidence of injury to the thecal sac or nerve roots.  Wound was then irrigated.  Gelfoam was placed topically for hemostasis.  Wounds then closed in layers with Vicryl sutures.   Steri-Strips and sterile dressing were applied.  No apparent complications.  Patient tolerated the procedure well and she returns recovering postop.

## 2023-07-14 NOTE — Anesthesia Procedure Notes (Signed)
Procedure Name: Intubation Date/Time: 07/14/2023 8:37 AM  Performed by: Kayleen Memos, CRNAPre-anesthesia Checklist: Patient identified, Emergency Drugs available, Suction available, Patient being monitored and Timeout performed Patient Re-evaluated:Patient Re-evaluated prior to induction Oxygen Delivery Method: Circle system utilized Preoxygenation: Pre-oxygenation with 100% oxygen Induction Type: IV induction Ventilation: Mask ventilation without difficulty Laryngoscope Size: Mac and 3 Grade View: Grade I Tube type: Oral Tube size: 7.0 mm Number of attempts: 1 Airway Equipment and Method: Stylet Placement Confirmation: ETT inserted through vocal cords under direct vision, positive ETCO2 and breath sounds checked- equal and bilateral Secured at: 21 cm Tube secured with: Tape Dental Injury: Teeth and Oropharynx as per pre-operative assessment

## 2023-07-15 ENCOUNTER — Encounter (HOSPITAL_COMMUNITY): Payer: Self-pay | Admitting: Neurosurgery

## 2023-07-15 DIAGNOSIS — M5416 Radiculopathy, lumbar region: Secondary | ICD-10-CM | POA: Diagnosis not present

## 2023-07-15 MED ORDER — METHOCARBAMOL 500 MG PO TABS: 500.0000 mg | ORAL_TABLET | Freq: Four times a day (QID) | ORAL | 1 refills | Status: DC | PRN

## 2023-07-15 MED ORDER — HYDROCODONE-ACETAMINOPHEN 5-325 MG PO TABS: 1.0000 | ORAL_TABLET | ORAL | 0 refills | Status: DC | PRN

## 2023-07-15 NOTE — Plan of Care (Signed)
  Problem: Education: Goal: Ability to verbalize activity precautions or restrictions will improve Outcome: Completed/Met Goal: Knowledge of the prescribed therapeutic regimen will improve Outcome: Completed/Met Goal: Understanding of discharge needs will improve Outcome: Completed/Met   Problem: Activity: Goal: Ability to avoid complications of mobility impairment will improve Outcome: Completed/Met Goal: Ability to tolerate increased activity will improve Outcome: Completed/Met Goal: Will remain free from falls Outcome: Completed/Met   Problem: Bowel/Gastric: Goal: Gastrointestinal status for postoperative course will improve Outcome: Completed/Met   Problem: Clinical Measurements: Goal: Ability to maintain clinical measurements within normal limits will improve Outcome: Completed/Met Goal: Postoperative complications will be avoided or minimized Outcome: Completed/Met Goal: Diagnostic test results will improve Outcome: Completed/Met   Problem: Pain Management: Goal: Pain level will decrease Outcome: Completed/Met   Problem: Skin Integrity: Goal: Will show signs of wound healing Outcome: Completed/Met   Problem: Health Behavior/Discharge Planning: Goal: Identification of resources available to assist in meeting health care needs will improve Outcome: Completed/Met   Problem: Bladder/Genitourinary: Goal: Urinary functional status for postoperative course will improve Outcome: Completed/Met Patient alert and oriented, void, ambulate. Surgical site clean and dry no sign of infection. D/c instructions explain and given.

## 2023-07-15 NOTE — Discharge Summary (Signed)
Physician Discharge Summary  Patient ID: Debra Roth MRN: [id number] DOB/AGE: 78/14/1947 78 y.o.  Admit date: 07/14/2023 Discharge date: 07/15/2023  Admission Diagnoses:  Discharge Diagnoses:  Principal Problem:   Lumbar stenosis with neurogenic claudication   Discharged Condition: good  Hospital Course: Patient admitted to the hospital where she underwent uncomplicated bilateral L4-5 decompressive laminotomies and foraminotomies.  Postoperatively doing very well.  Back and lower extremity pain much improved.  Standing walking and voiding without difficulty.  Ready for discharge home.  Consults:   Significant Diagnostic Studies:   Treatments:   Discharge Exam: Blood pressure (!) 141/62, pulse 64, temperature 97.9 F (36.6 C), temperature source Oral, resp. rate 16, height 5\' 2"  (1.575 m), weight 61.2 kg, SpO2 100%. Awake and alert.  Oriented and appropriate.  Motor and sensory function intact.  Wound clean and dry.  Chest and abdomen benign.  Disposition: Discharge disposition: 01-Home or Self Care        Allergies as of 07/15/2023       Reactions   Egg Solids, Whole Hives   Lactose Diarrhea        Medication List     TAKE these medications    acetaminophen 650 MG CR tablet Commonly known as: TYLENOL Take 650-1,300 mg by mouth daily as needed for pain.   Botox 100 units Solr injection Generic drug: botulinum toxin Type A Inject 33.5 Units as directed every 3 (three) months.   buPROPion 150 MG 24 hr tablet Commonly known as: WELLBUTRIN XL TAKE 1 TABLET (150 MG TOTAL) BY MOUTH DAILY. What changed: when to take this   cetirizine 10 MG tablet Commonly known as: ZYRTEC Take 10 mg by mouth daily as needed for allergies.   diazepam 5 MG tablet Commonly known as: VALIUM Take one at night and one additional in daytime if needed for anxiety and panic What changed:  how much to take how to take this when to take this reasons to take  this additional instructions   hydrochlorothiazide 25 MG tablet Commonly known as: HYDRODIURIL Take 25 mg by mouth daily.   HYDROcodone-acetaminophen 5-325 MG tablet Commonly known as: NORCO/VICODIN Take 1 tablet by mouth every 4 (four) hours as needed for moderate pain ((score 4 to 6)).   ibuprofen 200 MG tablet Commonly known as: ADVIL Take 400 mg by mouth daily as needed for moderate pain.   lidocaine 5 % Commonly known as: Lidoderm Place 1 patch onto the skin daily. Remove & Discard patch within 12 hours or as directed by MD   loperamide 2 MG capsule Commonly known as: IMODIUM Take 2-4 mg by mouth as needed for diarrhea or loose stools.   meclizine 12.5 MG tablet Commonly known as: ANTIVERT Take 12.5 mg by mouth 3 (three) times daily as needed for dizziness.   methocarbamol 500 MG tablet Commonly known as: ROBAXIN Take 1 tablet (500 mg total) by mouth every 6 (six) hours as needed for muscle spasms.   metoprolol tartrate 25 MG tablet Commonly known as: LOPRESSOR TAKE 1 TABLET (25 MG TOTAL) BY MOUTH DAILY. What changed: when to take this   omeprazole 20 MG capsule Commonly known as: PRILOSEC Take 20 mg by mouth daily as needed (acid reflux).   pravastatin 20 MG tablet Commonly known as: PRAVACHOL Take 20 mg by mouth at bedtime.   ramipril 10 MG capsule Commonly known as: ALTACE Take 10 mg by mouth daily.   SYSTANE OP Place 1 drop into both eyes daily as needed (dry eyes).  tetrahydrozoline-zinc 0.05-0.25 % ophthalmic solution Commonly known as: VISINE-AC Place 1 drop into both eyes daily as needed (allergies).        Follow-up Information     Julio Sicks, MD. Call.   Specialty: Neurosurgery Why: As needed, If symptoms worsen Contact information: [address] [place] [address] [phone]                 Signed: Temple Pacini 07/15/2023, 10:04 AM

## 2023-07-15 NOTE — Discharge Instructions (Signed)
Wound Care Keep incision covered and dry for three days.   Do not put any creams, lotions, or ointments on incision. Leave steri-strips on back.  They will fall off by themselves. Activity Walk each and every day, increasing distance each day. No lifting greater than 8 lbs.  Avoid excessive neck motion. No driving for 2 weeks; may ride as a passenger locally.  Diet Resume your normal diet.   Call Your Doctor If Any of These Occur Redness, drainage, or swelling at the wound.  Temperature greater than 101 degrees. Severe pain not relieved by pain medication. Incision starts to come apart. Follow Up Appt Call [phone])  for problems.

## 2023-07-15 NOTE — Evaluation (Signed)
Occupational Therapy Evaluation Patient Details Name: Debra Roth MRN: [id number] DOB: May 30, 1946 Today's Date: 07/15/2023   History of Present Illness 77 yo s/p 8/26 laminectomy and foraminotomy L 4-5 PMH CVA HTN anxiety   Clinical Impression   Patient evaluated by Occupational Therapy with no further acute OT needs identified. All education has been completed and the patient has no further questions. See below for any follow-up Occupational Therapy or equipment needs. OT to sign off. Thank you for referral.     Pt has a small dog that uses pads in the house that daughter is caring for dog for now. Pt advised to use reacher to avoid bending to the floor level. Pt was unable to demonstrate squat at this time.     If plan is discharge home, recommend the following:      Functional Status Assessment  Patient has had a recent decline in their functional status and demonstrates the ability to make significant improvements in function in a reasonable and predictable amount of time.  Equipment Recommendations  None recommended by OT    Recommendations for Other Services       Precautions / Restrictions Precautions Precautions: Back Precaution Comments: back handout provided and reviewed for adls. Restrictions Weight Bearing Restrictions: No      Mobility Bed Mobility Overal bed mobility: Modified Independent                  Transfers Overall transfer level: Modified independent                        Balance Overall balance assessment: Mild deficits observed, not formally tested                                         ADL either performed or assessed with clinical judgement   ADL Overall ADL's : Modified independent                                       General ADL Comments: pt able to complete full sink adl with cups, bathroom transfer with hyigene. bed mobility and dressing at EOB. pt completed 5 stair transfer  into the home with L side rail used     Vision Baseline Vision/History: 1 Wears glasses Ability to See in Adequate Light: 0 Adequate Patient Visual Report: No change from baseline Vision Assessment?: No apparent visual deficits     Perception Perception: Not tested       Praxis Praxis: Not tested       Pertinent Vitals/Pain Pain Assessment Pain Assessment: No/denies pain     Extremity/Trunk Assessment Upper Extremity Assessment Upper Extremity Assessment: Overall WFL for tasks assessed   Lower Extremity Assessment Lower Extremity Assessment: Overall WFL for tasks assessed   Cervical / Trunk Assessment Cervical / Trunk Assessment: Back Surgery   Communication Communication Communication: No apparent difficulties   Cognition Arousal: Alert Behavior During Therapy: WFL for tasks assessed/performed Overall Cognitive Status: Within Functional Limits for tasks assessed                                       General Comments       Exercises  Shoulder Instructions      Home Living Family/patient expects to be discharged to:: Private residence Living Arrangements: Alone Available Help at Discharge: Family;Friend(s);Available 24 hours/day;Available PRN/intermittently Type of Home: House Home Access: Stairs to enter Entrance Stairs-Number of Steps: 5 Entrance Stairs-Rails: Left Home Layout: Two level;Able to live on main level with bedroom/bathroom     Bathroom Shower/Tub: Producer, television/film/video: Standard     Home Equipment: Cane - single point;Educational psychologist (4 wheels)   Additional Comments: will have niece pick her up today adn then daughter to stay with patient for 2 days at least      Prior Functioning/Environment Prior Level of Function : Independent/Modified Independent;Driving             Mobility Comments: uses cane and rollator- describes occasionally furniture walking in the home          OT Problem  List:        OT Treatment/Interventions:      OT Goals(Current goals can be found in the care plan section) Acute Rehab OT Goals Patient Stated Goal: to go home with niece around 10 am  OT Frequency:      Co-evaluation              AM-PAC OT "6 Clicks" Daily Activity     Outcome Measure Help from another person eating meals?: None Help from another person taking care of personal grooming?: None Help from another person toileting, which includes using toliet, bedpan, or urinal?: None Help from another person bathing (including washing, rinsing, drying)?: None Help from another person to put on and taking off regular upper body clothing?: None Help from another person to put on and taking off regular lower body clothing?: None 6 Click Score: 24   End of Session Equipment Utilized During Treatment: Rolling walker (2 wheels) Nurse Communication: Mobility status  Activity Tolerance: Patient tolerated treatment well Patient left: in chair;with call bell/phone within reach  OT Visit Diagnosis: Unsteadiness on feet (R26.81)                Time: 1610-9604 OT Time Calculation (min): 37 min Charges:  OT General Charges $OT Visit: 1 Visit OT Evaluation $OT Eval Moderate Complexity: 1 Mod OT Treatments $Self Care/Home Management : 8-22 mins   Brynn, OTR/L  Acute Rehabilitation Services Office: [phone] .   Mateo Flow 07/15/2023, 10:14 AM

## 2023-07-15 NOTE — Anesthesia Postprocedure Evaluation (Signed)
Anesthesia Post Note  Patient: Debra Roth  Procedure(s) Performed: Laminectomy and Foraminotomy - bilateral - Lumbar four-Lumbar five (Bilateral: Back)     Patient location during evaluation: PACU Anesthesia Type: General Level of consciousness: awake and alert Pain management: pain level controlled Vital Signs Assessment: post-procedure vital signs reviewed and stable Respiratory status: spontaneous breathing, nonlabored ventilation, respiratory function stable and patient connected to nasal cannula oxygen Cardiovascular status: blood pressure returned to baseline and stable Postop Assessment: no apparent nausea or vomiting Anesthetic complications: no   No notable events documented.  Last Vitals:  Vitals:   07/14/23 2354 07/15/23 0418  BP: (!) 147/70 (!) 143/82  Pulse: 71 74  Resp: 20 18  Temp: (!) 36.4 C 36.7 C  SpO2: 99% 98%    Last Pain:  Vitals:   07/15/23 0552  TempSrc:   PainSc: 2                  Roslind Michaux

## 2023-08-26 NOTE — Therapy (Signed)
OUTPATIENT PHYSICAL THERAPY THORACOLUMBAR EVALUATION   Patient Name: Debra Roth MRN: [id number] DOB:[birth year], 77 y.o., female Today's Date: 08/27/2023  END OF SESSION:  PT End of Session - 08/27/23 1543     Visit Number 1    Date for PT Re-Evaluation 11/05/23    PT Start Time 1458    PT Stop Time 1540    PT Time Calculation (min) 42 min    Activity Tolerance Patient tolerated treatment well    Behavior During Therapy WFL for tasks assessed/performed            Past Medical History:  Diagnosis Date   Allergy    Anxiety    Family history of adverse reaction to anesthesia    N & V sister and daughter and grandson   Hypertension    Stroke [facility]) 1990   Past Surgical History:  Procedure Laterality Date   BREAST REDUCTION SURGERY Bilateral 1993   BREAST SURGERY Bilateral    15 years ago   BUNIONECTOMY Bilateral    COSMETIC SURGERY     breast reduction   FRACTURE SURGERY Left    left arm and shoulder   LUMBAR LAMINECTOMY/DECOMPRESSION MICRODISCECTOMY Bilateral 07/14/2023   Procedure: Laminectomy and Foraminotomy - bilateral - Lumbar four-Lumbar five;  Surgeon: Julio Sicks, MD;  Location: [facility] OR;  Service: Neurosurgery;  Laterality: Bilateral;   OTHER SURGICAL HISTORY     blephmospasms-eyes   REDUCTION MAMMAPLASTY     ruptured disc     Dr. Wynelle Cleveland   Patient Active Problem List   Diagnosis Date Noted   Lumbar stenosis with neurogenic claudication 07/14/2023   Bradycardia 07/13/2017   Vestibular dysfunction 07/13/2017   TIA (transient ischemic attack) 07/11/2017   Blepharospasm 02/27/2016   History of shingles 06/08/2015   Adjustment disorder with mixed anxiety and depressed mood 06/03/2014   Essential hypertension 06/03/2014   Gastroesophageal reflux disease without esophagitis 06/03/2014   Diarrhea 06/03/2014   Sleep disturbance 06/03/2014   Hyperlipidemia 06/03/2014    PCP: Verlon Au, MD  REFERRING PROVIDER: Julio Sicks, MD  REFERRING  DIAG: M48.062 Lumbar stenosis with neurogenic claudication  Rationale for Evaluation and Treatment: Rehabilitation  THERAPY DIAG:  Abnormal posture  Difficulty in walking, not elsewhere classified  Muscle weakness (generalized)  Other lack of coordination  Unsteadiness on feet  ONSET DATE: 08/14/23  SUBJECTIVE:                                                                                                                                                                                           SUBJECTIVE STATEMENT: Patient reports She had spinal stenosis and arthritis. She  had 2-3 falls, delaying her surgery, but did had lumbar laminectomy and foraminectomy in "early September". She has not had any therapy post op.  PERTINENT HISTORY:  Per providing physician note: Post-op lumbar decompression surgery, back and radicular pain resolved. 07/15/23 Patient admitted to the hospital where she underwent uncomplicated bilateral L4-5 decompressive laminotomies and foraminotomies. Postoperatively doing very well. Back and lower extremity pain much improved. Standing walking and voiding without difficulty. Ready for discharge home.  PAIN:  Are you having pain? Yes: NPRS scale: 4-5/10 Pain location: Low back in L lateral hip  Pain description: tightness, grabbing Aggravating factors: Tried to move some boxes around Relieving factors: ice or heat  PRECAUTIONS: Back and Fall  RED FLAGS: None   WEIGHT BEARING RESTRICTIONS: No  FALLS:  Has patient fallen in last 6 months? Yes. Number of falls 2-3 prior to her back surgery  LIVING ENVIRONMENT: Lives with: lives alone Lives in: House/apartment Stairs: Yes: External: 5 steps; none Has following equipment at home: None  OCCUPATION: N/A  PLOF: Independent  PATIENT GOALS: patient would like to improve her balance, recover from the surgery, be able to walk further and without pain.  NEXT MD VISIT: As needed  OBJECTIVE:  Note:  Objective measures were completed at Evaluation unless otherwise noted.  DIAGNOSTIC FINDINGS:  Lumbar X ray 07/14/23 IMPRESSION: Surgical hardware posterior to the L5-S1 disc space.  COGNITION: Overall cognitive status: Within functional limits for tasks assessed    SENSATION: WFL  MUSCLE LENGTH: Hamstrings: Right 45 deg; Left 45 deg Thomas test: WFL  POSTURE: rounded shoulders, decreased lumbar lordosis, weight shift left, and trunk rotated to L  PALPATION: Tight and TTP lower lumbar paraspinals and L QL, gluts  LUMBAR ROM: All other motions deferred  AROM eval  Flexion Mid shin  Extension   Right lateral flexion   Left lateral flexion   Right rotation   Left rotation    (Blank rows = not tested)  LOWER EXTREMITY ROM:   B hips tight in all planes   LOWER EXTREMITY MMT:  RLE 4/5, LLE 3+/5   LUMBAR SPECIAL TESTS:  Straight leg raise test: Negative and Slump test: Negative  FUNCTIONAL TESTS:  5 times sit to stand: 14.86, leaned back against mat, felt unstable. Timed up and go (TUG): 24.66 very unsteady with multiple balance loss.  GAIT: Distance walked: In clinic distances Assistive device utilized: None Level of assistance: Modified independence Comments: Antalgic, unstable with lateral sway and unsteadiness  TODAY'S TREATMENT:                                                                                                                              DATE:  08/27/23 Education   PATIENT EDUCATION:  Education details: POC Person educated: Patient Education method: Explanation Education comprehension: verbalized understanding  HOME EXERCISE PROGRAM: Provided verbal education for PPT in supine  ASSESSMENT:  CLINICAL IMPRESSION: Patient is a 77 y.o. who was seen today for  physical therapy evaluation and treatment for S/P B lumbar laminotomies and foraminotomies on 07/15/23. She presents with weakness in trunk and extremities, stiffness in hips and trunk,  decreased balance, decreased safety and I in all functional mobility. She will benefit from PT to improve her controlled mobility and strength, and to improve her balance reactions and safety in standing and walking in order to return to her PLOF without pain or fear of falling.  OBJECTIVE IMPAIRMENTS: Abnormal gait, decreased activity tolerance, decreased balance, decreased coordination, decreased endurance, difficulty walking, decreased ROM, decreased strength, postural dysfunction, and pain.   ACTIVITY LIMITATIONS: carrying, lifting, bending, sitting, standing, squatting, sleeping, transfers, and locomotion level  PARTICIPATION LIMITATIONS: meal prep, cleaning, laundry, driving, shopping, and community activity  PERSONAL FACTORS: Past/current experiences are also affecting patient's functional outcome.   REHAB POTENTIAL: Good  CLINICAL DECISION MAKING: Stable/uncomplicated  EVALUATION COMPLEXITY: Low   GOALS: Goals reviewed with patient? Yes  SHORT TERM GOALS: Target date: 09/18/23  I with initial HEP Baseline: Goal status: INITIAL  LONG TERM GOALS: Target date: 11/05/23  I with final HEP Baseline:  Goal status: INITIAL  2.  Increase B LE strength to at least 4+/5 throughout Baseline:  Goal status: INITIAL  3.  Increase ROM throughout lower trunk and legs to WNL, with no pain Baseline:  Goal status: INITIAL  4.  Patient will be able to walk x at least 500' with LRAD, I, on level surfaces, no instability Baseline:  Goal status: INITIAL  5.  Complete 5x STS without pushing on the mat, no instability, < 15 sec Baseline: 14.8 Goal status: INITIAL  6.  Complete TUG in < 15 sec, no instability or LOB. Baseline: 24.66 Goal status: INITIAL  PLAN:  PT FREQUENCY: 1-2x/week  PT DURATION: 10 weeks  PLANNED INTERVENTIONS: Therapeutic exercises, Therapeutic activity, Neuromuscular re-education, Balance training, Gait training, Patient/Family education, Self Care, Joint  mobilization, Stair training, Dry Needling, Electrical stimulation, Spinal mobilization, Cryotherapy, Moist heat, Ionotophoresis 4mg /ml Dexamethasone, and Manual therapy.  PLAN FOR NEXT SESSION: Initiate HEP, trunk mobs within back precautions, hip ROM and strength   Iona Beard, DPT 08/27/2023, 4:29 PM

## 2023-08-27 ENCOUNTER — Encounter: Payer: Self-pay | Admitting: Physical Therapy

## 2023-08-27 ENCOUNTER — Ambulatory Visit: Payer: Medicare Other | Attending: Neurosurgery | Admitting: Physical Therapy

## 2023-08-27 DIAGNOSIS — M6281 Muscle weakness (generalized): Secondary | ICD-10-CM | POA: Diagnosis present

## 2023-08-27 DIAGNOSIS — R278 Other lack of coordination: Secondary | ICD-10-CM | POA: Diagnosis present

## 2023-08-27 DIAGNOSIS — R293 Abnormal posture: Secondary | ICD-10-CM | POA: Insufficient documentation

## 2023-08-27 DIAGNOSIS — R262 Difficulty in walking, not elsewhere classified: Secondary | ICD-10-CM | POA: Diagnosis present

## 2023-08-27 DIAGNOSIS — R2681 Unsteadiness on feet: Secondary | ICD-10-CM | POA: Diagnosis present

## 2023-09-03 ENCOUNTER — Ambulatory Visit: Payer: Medicare Other | Admitting: Physical Therapy

## 2023-09-05 ENCOUNTER — Ambulatory Visit: Payer: Medicare Other | Admitting: Physical Therapy

## 2023-09-08 ENCOUNTER — Ambulatory Visit: Payer: Medicare Other | Admitting: Physical Therapy

## 2023-09-09 ENCOUNTER — Ambulatory Visit: Payer: Medicare Other | Admitting: Physical Therapy

## 2023-09-12 ENCOUNTER — Ambulatory Visit: Payer: Medicare Other | Admitting: Physical Therapy

## 2023-09-15 ENCOUNTER — Ambulatory Visit: Payer: Medicare Other | Admitting: Physical Therapy

## 2023-09-15 ENCOUNTER — Encounter: Payer: Self-pay | Admitting: Physical Therapy

## 2023-09-15 DIAGNOSIS — R262 Difficulty in walking, not elsewhere classified: Secondary | ICD-10-CM

## 2023-09-15 DIAGNOSIS — R293 Abnormal posture: Secondary | ICD-10-CM

## 2023-09-15 DIAGNOSIS — R2681 Unsteadiness on feet: Secondary | ICD-10-CM

## 2023-09-15 DIAGNOSIS — M6281 Muscle weakness (generalized): Secondary | ICD-10-CM

## 2023-09-15 NOTE — Therapy (Signed)
OUTPATIENT PHYSICAL THERAPY THORACOLUMBAR EVALUATION   Patient Name: Debra Roth MRN: [id number] DOB:12-04-1945, 77 y.o., female Today's Date: 09/15/2023  END OF SESSION:  PT End of Session - 09/15/23 1442     Visit Number 2    Date for PT Re-Evaluation 11/05/23    PT Start Time 1443    PT Stop Time 1515    PT Time Calculation (min) 32 min    Activity Tolerance Patient tolerated treatment well    Behavior During Therapy WFL for tasks assessed/performed            Past Medical History:  Diagnosis Date   Allergy    Anxiety    Family history of adverse reaction to anesthesia    N & V sister and daughter and grandson   Hypertension    Stroke [facility]) 1990   Past Surgical History:  Procedure Laterality Date   BREAST REDUCTION SURGERY Bilateral 1993   BREAST SURGERY Bilateral    15 years ago   BUNIONECTOMY Bilateral    COSMETIC SURGERY     breast reduction   FRACTURE SURGERY Left    left arm and shoulder   LUMBAR LAMINECTOMY/DECOMPRESSION MICRODISCECTOMY Bilateral 07/14/2023   Procedure: Laminectomy and Foraminotomy - bilateral - Lumbar four-Lumbar five;  Surgeon: Julio Sicks, MD;  Location: [facility] OR;  Service: Neurosurgery;  Laterality: Bilateral;   OTHER SURGICAL HISTORY     blephmospasms-eyes   REDUCTION MAMMAPLASTY     ruptured disc     Dr. Wynelle Cleveland   Patient Active Problem List   Diagnosis Date Noted   Lumbar stenosis with neurogenic claudication 07/14/2023   Bradycardia 07/13/2017   Vestibular dysfunction 07/13/2017   TIA (transient ischemic attack) 07/11/2017   Blepharospasm 02/27/2016   History of shingles 06/08/2015   Adjustment disorder with mixed anxiety and depressed mood 06/03/2014   Essential hypertension 06/03/2014   Gastroesophageal reflux disease without esophagitis 06/03/2014   Diarrhea 06/03/2014   Sleep disturbance 06/03/2014   Hyperlipidemia 06/03/2014    PCP: Verlon Au, MD  REFERRING PROVIDER: Julio Sicks, MD  REFERRING  DIAG: M48.062 Lumbar stenosis with neurogenic claudication  Rationale for Evaluation and Treatment: Rehabilitation  THERAPY DIAG:  Abnormal posture  Difficulty in walking, not elsewhere classified  Muscle weakness (generalized)  Unsteadiness on feet  ONSET DATE: 08/14/23  SUBJECTIVE:                                                                                                                                                                                           SUBJECTIVE STATEMENT: "Good, stiff, and I just can't figure out why this leg is tight"  PERTINENT HISTORY:  Per providing physician note: Post-op lumbar decompression surgery, back and radicular pain resolved. 07/15/23 Patient admitted to the hospital where she underwent uncomplicated bilateral L4-5 decompressive laminotomies and foraminotomies. Postoperatively doing very well. Back and lower extremity pain much improved. Standing walking and voiding without difficulty. Ready for discharge home.  PAIN:  Are you having pain? Yes: NPRS scale: 0/10 Pain location: Low back in L lateral hip  Pain description: tightness, grabbing Aggravating factors: Tried to move some boxes around Relieving factors: ice or heat  PRECAUTIONS: Back and Fall  RED FLAGS: None   WEIGHT BEARING RESTRICTIONS: No  FALLS:  Has patient fallen in last 6 months? Yes. Number of falls 2-3 prior to her back surgery  LIVING ENVIRONMENT: Lives with: lives alone Lives in: House/apartment Stairs: Yes: External: 5 steps; none Has following equipment at home: None  OCCUPATION: N/A  PLOF: Independent  PATIENT GOALS: patient would like to improve her balance, recover from the surgery, be able to walk further and without pain.  NEXT MD VISIT: As needed  OBJECTIVE:  Note: Objective measures were completed at Evaluation unless otherwise noted.  DIAGNOSTIC FINDINGS:  Lumbar X ray 07/14/23 IMPRESSION: Surgical hardware posterior to the L5-S1  disc space.  COGNITION: Overall cognitive status: Within functional limits for tasks assessed    SENSATION: WFL  MUSCLE LENGTH: Hamstrings: Right 45 deg; Left 45 deg Thomas test: WFL  POSTURE: rounded shoulders, decreased lumbar lordosis, weight shift left, and trunk rotated to L  PALPATION: Tight and TTP lower lumbar paraspinals and L QL, gluts  LUMBAR ROM: All other motions deferred  AROM eval  Flexion Mid shin  Extension   Right lateral flexion   Left lateral flexion   Right rotation   Left rotation    (Blank rows = not tested)  LOWER EXTREMITY ROM:   B hips tight in all planes   LOWER EXTREMITY MMT:  RLE 4/5, LLE 3+/5   LUMBAR SPECIAL TESTS:  Straight leg raise test: Negative and Slump test: Negative  FUNCTIONAL TESTS:  5 times sit to stand: 14.86, leaned back against mat, felt unstable. Timed up and go (TUG): 24.66 very unsteady with multiple balance loss.  GAIT: Distance walked: In clinic distances Assistive device utilized: None Level of assistance: Modified independence Comments: Antalgic, unstable with lateral sway and unsteadiness  TODAY'S TREATMENT:                                                                                                                              DATE:  09/15/23 NuStep L3 x 5 min Seated rows red 2x10 Standing shoulder Ext red 2x10 LE PROM HS, piriformis, single K2X, lower trunk rotation  08/27/23 Education   PATIENT EDUCATION:  Education details: POC Person educated: Patient Education method: Explanation Education comprehension: verbalized understanding  HOME EXERCISE PROGRAM: Provided verbal education for PPT in supine  ASSESSMENT:  CLINICAL IMPRESSION: Patient is a 77 y.o. who was seen today for  physical therapy  treatment for S/P B lumbar laminotomies and foraminotomies on 07/15/23. She enters ~ 13 minutes late for today's session. Session consisted of light postural and HS strengthening with wine light  stretching. No reports of increase pain during session. Bilateral tight HS and calf notes. She will benefit from PT to improve her controlled mobility and strength, and to improve her balance reactions and safety in standing and walking in order to return to her PLOF without pain or fear of falling.  OBJECTIVE IMPAIRMENTS: Abnormal gait, decreased activity tolerance, decreased balance, decreased coordination, decreased endurance, difficulty walking, decreased ROM, decreased strength, postural dysfunction, and pain.   ACTIVITY LIMITATIONS: carrying, lifting, bending, sitting, standing, squatting, sleeping, transfers, and locomotion level  PARTICIPATION LIMITATIONS: meal prep, cleaning, laundry, driving, shopping, and community activity  PERSONAL FACTORS: Past/current experiences are also affecting patient's functional outcome.   REHAB POTENTIAL: Good  CLINICAL DECISION MAKING: Stable/uncomplicated  EVALUATION COMPLEXITY: Low   GOALS: Goals reviewed with patient? Yes  SHORT TERM GOALS: Target date: 09/18/23  I with initial HEP Baseline: Goal status: INITIAL  LONG TERM GOALS: Target date: 11/05/23  I with final HEP Baseline:  Goal status: INITIAL  2.  Increase B LE strength to at least 4+/5 throughout Baseline:  Goal status: INITIAL  3.  Increase ROM throughout lower trunk and legs to WNL, with no pain Baseline:  Goal status: INITIAL  4.  Patient will be able to walk x at least 500' with LRAD, I, on level surfaces, no instability Baseline:  Goal status: INITIAL  5.  Complete 5x STS without pushing on the mat, no instability, < 15 sec Baseline: 14.8 Goal status: INITIAL  6.  Complete TUG in < 15 sec, no instability or LOB. Baseline: 24.66 Goal status: INITIAL  PLAN:  PT FREQUENCY: 1-2x/week  PT DURATION: 10 weeks  PLANNED INTERVENTIONS: Therapeutic exercises, Therapeutic activity, Neuromuscular re-education, Balance training, Gait training, Patient/Family  education, Self Care, Joint mobilization, Stair training, Dry Needling, Electrical stimulation, Spinal mobilization, Cryotherapy, Moist heat, Ionotophoresis 4mg /ml Dexamethasone, and Manual therapy.  PLAN FOR NEXT SESSION: Initiate HEP, trunk mobs within back precautions, hip ROM and strength   Iona Beard, DPT 09/15/2023, 2:43 PM

## 2023-09-17 ENCOUNTER — Ambulatory Visit: Payer: Medicare Other | Admitting: Physical Therapy

## 2023-09-17 ENCOUNTER — Encounter: Payer: Self-pay | Admitting: Physical Therapy

## 2023-09-17 DIAGNOSIS — R293 Abnormal posture: Secondary | ICD-10-CM | POA: Diagnosis not present

## 2023-09-17 DIAGNOSIS — M6281 Muscle weakness (generalized): Secondary | ICD-10-CM

## 2023-09-17 DIAGNOSIS — R262 Difficulty in walking, not elsewhere classified: Secondary | ICD-10-CM

## 2023-09-17 DIAGNOSIS — R2681 Unsteadiness on feet: Secondary | ICD-10-CM

## 2023-09-17 NOTE — Therapy (Signed)
OUTPATIENT PHYSICAL THERAPY THORACOLUMBAR EVALUATION   Patient Name: Debra Roth MRN: [id number] DOB:Aug 14, 1946, 77 y.o., female Today's Date: 09/17/2023  END OF SESSION:  PT End of Session - 09/17/23 1347     Visit Number 3    Date for PT Re-Evaluation 11/05/23    PT Start Time 1347    PT Stop Time 1430    PT Time Calculation (min) 43 min    Activity Tolerance Patient tolerated treatment well    Behavior During Therapy WFL for tasks assessed/performed            Past Medical History:  Diagnosis Date   Allergy    Anxiety    Family history of adverse reaction to anesthesia    N & V sister and daughter and grandson   Hypertension    Stroke [facility]) 1990   Past Surgical History:  Procedure Laterality Date   BREAST REDUCTION SURGERY Bilateral 1993   BREAST SURGERY Bilateral    15 years ago   BUNIONECTOMY Bilateral    COSMETIC SURGERY     breast reduction   FRACTURE SURGERY Left    left arm and shoulder   LUMBAR LAMINECTOMY/DECOMPRESSION MICRODISCECTOMY Bilateral 07/14/2023   Procedure: Laminectomy and Foraminotomy - bilateral - Lumbar four-Lumbar five;  Surgeon: Julio Sicks, MD;  Location: [facility] OR;  Service: Neurosurgery;  Laterality: Bilateral;   OTHER SURGICAL HISTORY     blephmospasms-eyes   REDUCTION MAMMAPLASTY     ruptured disc     Dr. Wynelle Cleveland   Patient Active Problem List   Diagnosis Date Noted   Lumbar stenosis with neurogenic claudication 07/14/2023   Bradycardia 07/13/2017   Vestibular dysfunction 07/13/2017   TIA (transient ischemic attack) 07/11/2017   Blepharospasm 02/27/2016   History of shingles 06/08/2015   Adjustment disorder with mixed anxiety and depressed mood 06/03/2014   Essential hypertension 06/03/2014   Gastroesophageal reflux disease without esophagitis 06/03/2014   Diarrhea 06/03/2014   Sleep disturbance 06/03/2014   Hyperlipidemia 06/03/2014    PCP: Verlon Au, MD  REFERRING PROVIDER: Julio Sicks, MD  REFERRING  DIAG: M48.062 Lumbar stenosis with neurogenic claudication  Rationale for Evaluation and Treatment: Rehabilitation  THERAPY DIAG:  Abnormal posture  Muscle weakness (generalized)  Unsteadiness on feet  Difficulty in walking, not elsewhere classified  ONSET DATE: 08/14/23  SUBJECTIVE:                                                                                                                                                                                           SUBJECTIVE STATEMENT: Been out [employer], No pain just tightness in the L glute  PERTINENT  HISTORY:  Per providing physician note: Post-op lumbar decompression surgery, back and radicular pain resolved. 07/15/23 Patient admitted to the hospital where she underwent uncomplicated bilateral L4-5 decompressive laminotomies and foraminotomies. Postoperatively doing very well. Back and lower extremity pain much improved. Standing walking and voiding without difficulty. Ready for discharge home.  PAIN:  Are you having pain? Yes: NPRS scale: 0/10 Pain location: Low back in L lateral hip  Pain description: tightness, grabbing Aggravating factors: Tried to move some boxes around Relieving factors: ice or heat  PRECAUTIONS: Back and Fall  RED FLAGS: None   WEIGHT BEARING RESTRICTIONS: No  FALLS:  Has patient fallen in last 6 months? Yes. Number of falls 2-3 prior to her back surgery  LIVING ENVIRONMENT: Lives with: lives alone Lives in: House/apartment Stairs: Yes: External: 5 steps; none Has following equipment at home: None  OCCUPATION: N/A  PLOF: Independent  PATIENT GOALS: patient would like to improve her balance, recover from the surgery, be able to walk further and without pain.  NEXT MD VISIT: As needed  OBJECTIVE:  Note: Objective measures were completed at Evaluation unless otherwise noted.  DIAGNOSTIC FINDINGS:  Lumbar X ray 07/14/23 IMPRESSION: Surgical hardware posterior to the L5-S1 disc  space.  COGNITION: Overall cognitive status: Within functional limits for tasks assessed    SENSATION: WFL  MUSCLE LENGTH: Hamstrings: Right 45 deg; Left 45 deg Thomas test: WFL  POSTURE: rounded shoulders, decreased lumbar lordosis, weight shift left, and trunk rotated to L  PALPATION: Tight and TTP lower lumbar paraspinals and L QL, gluts  LUMBAR ROM: All other motions deferred  AROM eval  Flexion Mid shin  Extension   Right lateral flexion   Left lateral flexion   Right rotation   Left rotation    (Blank rows = not tested)  LOWER EXTREMITY ROM:   B hips tight in all planes   LOWER EXTREMITY MMT:  RLE 4/5, LLE 3+/5   LUMBAR SPECIAL TESTS:  Straight leg raise test: Negative and Slump test: Negative  FUNCTIONAL TESTS:  5 times sit to stand: 14.86, leaned back against mat, felt unstable. Timed up and go (TUG): 24.66 very unsteady with multiple balance loss.  GAIT: Distance walked: In clinic distances Assistive device utilized: None Level of assistance: Modified independence Comments: Antalgic, unstable with lateral sway and unsteadiness  TODAY'S TREATMENT:                                                                                                                              DATE:  09/16/20 Bike L1.5 x 6 min HS curls 20lb 2x10 Leg Ext 5lb 2x10 S2S holding 3lb 2x10 LE PROM HS, piriformis, single K2X, lower trunk rotation  09/15/23 NuStep L3 x 5 min Seated rows red 2x10 Standing shoulder Ext red 2x10 LE PROM HS, piriformis, single K2X, lower trunk rotation  08/27/23 Education   PATIENT EDUCATION:  Education details: POC Person educated: Patient Education method: Explanation Education comprehension: verbalized  understanding  HOME EXERCISE PROGRAM: Provided verbal education for PPT in supine  ASSESSMENT:  CLINICAL IMPRESSION: Patient is a 77 y.o. who was seen today for physical therapy  treatment for S/P B lumbar laminotomies and  foraminotomies on 07/15/23. She enters doing well with no pain. Session emphasized Le strength and LE stretching. No reports of increase pain during session. Bilateral tight HS and calf noted. Cues for full ROM needed with seated leg extensions and curls. Cue for L placement needed with sit to stands. She will benefit from PT to improve her controlled mobility and strength, and to improve her balance reactions and safety in standing and walking in order to return to her PLOF without pain or fear of falling.  OBJECTIVE IMPAIRMENTS: Abnormal gait, decreased activity tolerance, decreased balance, decreased coordination, decreased endurance, difficulty walking, decreased ROM, decreased strength, postural dysfunction, and pain.   ACTIVITY LIMITATIONS: carrying, lifting, bending, sitting, standing, squatting, sleeping, transfers, and locomotion level  PARTICIPATION LIMITATIONS: meal prep, cleaning, laundry, driving, shopping, and community activity  PERSONAL FACTORS: Past/current experiences are also affecting patient's functional outcome.   REHAB POTENTIAL: Good  CLINICAL DECISION MAKING: Stable/uncomplicated  EVALUATION COMPLEXITY: Low   GOALS: Goals reviewed with patient? Yes  SHORT TERM GOALS: Target date: 09/18/23  I with initial HEP Baseline: Goal status: INITIAL  LONG TERM GOALS: Target date: 11/05/23  I with final HEP Baseline:  Goal status: INITIAL  2.  Increase B LE strength to at least 4+/5 throughout Baseline:  Goal status: INITIAL  3.  Increase ROM throughout lower trunk and legs to WNL, with no pain Baseline:  Goal status: INITIAL  4.  Patient will be able to walk x at least 500' with LRAD, I, on level surfaces, no instability Baseline:  Goal status: INITIAL  5.  Complete 5x STS without pushing on the mat, no instability, < 15 sec Baseline: 14.8 Goal status: INITIAL  6.  Complete TUG in < 15 sec, no instability or LOB. Baseline: 24.66 Goal status:  INITIAL  PLAN:  PT FREQUENCY: 1-2x/week  PT DURATION: 10 weeks  PLANNED INTERVENTIONS: Therapeutic exercises, Therapeutic activity, Neuromuscular re-education, Balance training, Gait training, Patient/Family education, Self Care, Joint mobilization, Stair training, Dry Needling, Electrical stimulation, Spinal mobilization, Cryotherapy, Moist heat, Ionotophoresis 4mg /ml Dexamethasone, and Manual therapy.  PLAN FOR NEXT SESSION: Initiate HEP, trunk mobs within back precautions, hip ROM and strength   Debroah Baller, PTA 09/17/2023, 1:48 PM

## 2023-09-22 ENCOUNTER — Encounter: Payer: Self-pay | Admitting: Physical Therapy

## 2023-09-22 ENCOUNTER — Ambulatory Visit: Payer: Medicare Other | Attending: Neurosurgery | Admitting: Physical Therapy

## 2023-09-22 DIAGNOSIS — R262 Difficulty in walking, not elsewhere classified: Secondary | ICD-10-CM | POA: Diagnosis present

## 2023-09-22 DIAGNOSIS — R293 Abnormal posture: Secondary | ICD-10-CM | POA: Diagnosis present

## 2023-09-22 DIAGNOSIS — R2681 Unsteadiness on feet: Secondary | ICD-10-CM | POA: Insufficient documentation

## 2023-09-22 DIAGNOSIS — M6281 Muscle weakness (generalized): Secondary | ICD-10-CM | POA: Diagnosis present

## 2023-09-22 DIAGNOSIS — R278 Other lack of coordination: Secondary | ICD-10-CM | POA: Diagnosis present

## 2023-09-22 NOTE — Therapy (Signed)
OUTPATIENT PHYSICAL THERAPY THORACOLUMBAR EVALUATION   Patient Name: Debra Roth MRN: [id number] DOB:02-06-1946, 77 y.o., female Today's Date: 09/22/2023  END OF SESSION:  PT End of Session - 09/22/23 1503     Visit Number 4    Date for PT Re-Evaluation 11/05/23    PT Start Time 1503    PT Stop Time 1530    PT Time Calculation (min) 27 min            Past Medical History:  Diagnosis Date   Allergy    Anxiety    Family history of adverse reaction to anesthesia    N & V sister and daughter and grandson   Hypertension    Stroke [facility]) 1990   Past Surgical History:  Procedure Laterality Date   BREAST REDUCTION SURGERY Bilateral 1993   BREAST SURGERY Bilateral    15 years ago   BUNIONECTOMY Bilateral    COSMETIC SURGERY     breast reduction   FRACTURE SURGERY Left    left arm and shoulder   LUMBAR LAMINECTOMY/DECOMPRESSION MICRODISCECTOMY Bilateral 07/14/2023   Procedure: Laminectomy and Foraminotomy - bilateral - Lumbar four-Lumbar five;  Surgeon: Julio Sicks, MD;  Location: [facility] OR;  Service: Neurosurgery;  Laterality: Bilateral;   OTHER SURGICAL HISTORY     blephmospasms-eyes   REDUCTION MAMMAPLASTY     ruptured disc     Dr. Wynelle Cleveland   Patient Active Problem List   Diagnosis Date Noted   Lumbar stenosis with neurogenic claudication 07/14/2023   Bradycardia 07/13/2017   Vestibular dysfunction 07/13/2017   TIA (transient ischemic attack) 07/11/2017   Blepharospasm 02/27/2016   History of shingles 06/08/2015   Adjustment disorder with mixed anxiety and depressed mood 06/03/2014   Essential hypertension 06/03/2014   Gastroesophageal reflux disease without esophagitis 06/03/2014   Diarrhea 06/03/2014   Sleep disturbance 06/03/2014   Hyperlipidemia 06/03/2014    PCP: Verlon Au, MD  REFERRING PROVIDER: Julio Sicks, MD  REFERRING DIAG: M48.062 Lumbar stenosis with neurogenic claudication  Rationale for Evaluation and Treatment:  Rehabilitation  THERAPY DIAG:  Abnormal posture  Muscle weakness (generalized)  Unsteadiness on feet  Difficulty in walking, not elsewhere classified  Other lack of coordination  ONSET DATE: 08/14/23  SUBJECTIVE:                                                                                                                                                                                           SUBJECTIVE STATEMENT: Patient reports continued L lateral hip pain as well as tightness in proximal  PERTINENT HISTORY:  Per providing physician note: Post-op lumbar decompression surgery, back and radicular  pain resolved. 07/15/23 Patient admitted to the hospital where she underwent uncomplicated bilateral L4-5 decompressive laminotomies and foraminotomies. Postoperatively doing very well. Back and lower extremity pain much improved. Standing walking and voiding without difficulty. Ready for discharge home.  PAIN:  Are you having pain? Yes: NPRS scale: 0/10 Pain location: Low back in L lateral hip  Pain description: tightness, grabbing Aggravating factors: Tried to move some boxes around Relieving factors: ice or heat  PRECAUTIONS: Back and Fall  RED FLAGS: None   WEIGHT BEARING RESTRICTIONS: No  FALLS:  Has patient fallen in last 6 months? Yes. Number of falls 2-3 prior to her back surgery  LIVING ENVIRONMENT: Lives with: lives alone Lives in: House/apartment Stairs: Yes: External: 5 steps; none Has following equipment at home: None  OCCUPATION: N/A  PLOF: Independent  PATIENT GOALS: patient would like to improve her balance, recover from the surgery, be able to walk further and without pain.  NEXT MD VISIT: As needed  OBJECTIVE:  Note: Objective measures were completed at Evaluation unless otherwise noted.  DIAGNOSTIC FINDINGS:  Lumbar X ray 07/14/23 IMPRESSION: Surgical hardware posterior to the L5-S1 disc space.  COGNITION: Overall cognitive status: Within  functional limits for tasks assessed    SENSATION: WFL  MUSCLE LENGTH: Hamstrings: Right 45 deg; Left 45 deg Thomas test: WFL  POSTURE: rounded shoulders, decreased lumbar lordosis, weight shift left, and trunk rotated to L  PALPATION: Tight and TTP lower lumbar paraspinals and L QL, gluts  LUMBAR ROM: All other motions deferred  AROM eval  Flexion Mid shin  Extension   Right lateral flexion   Left lateral flexion   Right rotation   Left rotation    (Blank rows = not tested)  LOWER EXTREMITY ROM:   B hips tight in all planes   LOWER EXTREMITY MMT:  RLE 4/5, LLE 3+/5   LUMBAR SPECIAL TESTS:  Straight leg raise test: Negative and Slump test: Negative  FUNCTIONAL TESTS:  5 times sit to stand: 14.86, leaned back against mat, felt unstable. Timed up and go (TUG): 24.66 very unsteady with multiple balance loss.  GAIT: Distance walked: In clinic distances Assistive device utilized: None Level of assistance: Modified independence Comments: Antalgic, unstable with lateral sway and unsteadiness  TODAY'S TREATMENT:                                                                                                                              DATE:  09/22/23 Supine stretch-SKTC, figure 4. She reported no stretch in gluts or piriformis, but noted some tightness in prox add tendon, no deep in groin. Supine strengthening- PPT, bridge, clamshell with G tband, ball squeeze, 10 each   09/16/20 Bike L1.5 x 6 min HS curls 20lb 2x10 Leg Ext 5lb 2x10 S2S holding 3lb 2x10 LE PROM HS, piriformis, single K2X, lower trunk rotation  09/15/23 NuStep L3 x 5 min Seated rows red 2x10 Standing shoulder Ext red 2x10 LE PROM HS,  piriformis, single K2X, lower trunk rotation  08/27/23 Education   PATIENT EDUCATION:  Education details: POC Person educated: Patient Education method: Explanation Education comprehension: verbalized understanding  HOME EXERCISE PROGRAM: Provided verbal  education for PPT in supine, [id number]  ASSESSMENT:  CLINICAL IMPRESSION: Patient reports that her dog has been sick and she is exhausted. Initiated HEP for stretch and strengthening. She did not feel any of the stretching in the expected location, but felt the strength exercises were effective.  OBJECTIVE IMPAIRMENTS: Abnormal gait, decreased activity tolerance, decreased balance, decreased coordination, decreased endurance, difficulty walking, decreased ROM, decreased strength, postural dysfunction, and pain.   ACTIVITY LIMITATIONS: carrying, lifting, bending, sitting, standing, squatting, sleeping, transfers, and locomotion level  PARTICIPATION LIMITATIONS: meal prep, cleaning, laundry, driving, shopping, and community activity  PERSONAL FACTORS: Past/current experiences are also affecting patient's functional outcome.   REHAB POTENTIAL: Good  CLINICAL DECISION MAKING: Stable/uncomplicated  EVALUATION COMPLEXITY: Low   GOALS: Goals reviewed with patient? Yes  SHORT TERM GOALS: Target date: 09/18/23  I with initial HEP Baseline: Goal status: 09/22/23-patient provided with HEP, ongoing  LONG TERM GOALS: Target date: 11/05/23  I with final HEP Baseline:  Goal status: INITIAL  2.  Increase B LE strength to at least 4+/5 throughout Baseline:  Goal status: INITIAL  3.  Increase ROM throughout lower trunk and legs to WNL, with no pain Baseline:  Goal status: INITIAL  4.  Patient will be able to walk x at least 500' with LRAD, I, on level surfaces, no instability Baseline:  Goal status: INITIAL  5.  Complete 5x STS without pushing on the mat, no instability, < 15 sec Baseline: 14.8 Goal status: INITIAL  6.  Complete TUG in < 15 sec, no instability or LOB. Baseline: 24.66 Goal status: INITIAL  PLAN:  PT FREQUENCY: 1-2x/week  PT DURATION: 10 weeks  PLANNED INTERVENTIONS: Therapeutic exercises, Therapeutic activity, Neuromuscular re-education, Balance training,  Gait training, Patient/Family education, Self Care, Joint mobilization, Stair training, Dry Needling, Electrical stimulation, Spinal mobilization, Cryotherapy, Moist heat, Ionotophoresis 4mg /ml Dexamethasone, and Manual therapy.  PLAN FOR NEXT SESSION: Initiate HEP, trunk mobs within back precautions, hip ROM and strength  Oley Balm DPT 09/22/23 3:33 PM  09/22/2023, 3:33 PM

## 2023-09-24 ENCOUNTER — Ambulatory Visit: Payer: Medicare Other | Admitting: Physical Therapy

## 2023-09-30 ENCOUNTER — Ambulatory Visit: Payer: Medicare Other | Admitting: Physical Therapy

## 2024-03-22 ENCOUNTER — Encounter (HOSPITAL_COMMUNITY): Payer: Self-pay | Admitting: Emergency Medicine

## 2024-03-22 ENCOUNTER — Emergency Department (HOSPITAL_COMMUNITY)
Admission: EM | Admit: 2024-03-22 | Discharge: 2024-03-22 | Disposition: A | Discharge status: Home / Self Care | Attending: Emergency Medicine | Admitting: Emergency Medicine

## 2024-03-22 ENCOUNTER — Emergency Department (HOSPITAL_COMMUNITY)

## 2024-03-22 DIAGNOSIS — I10 Essential (primary) hypertension: Secondary | ICD-10-CM | POA: Insufficient documentation

## 2024-03-22 DIAGNOSIS — E871 Hypo-osmolality and hyponatremia: Secondary | ICD-10-CM | POA: Diagnosis not present

## 2024-03-22 DIAGNOSIS — U071 COVID-19: Secondary | ICD-10-CM | POA: Insufficient documentation

## 2024-03-22 DIAGNOSIS — R0602 Shortness of breath: Secondary | ICD-10-CM | POA: Diagnosis present

## 2024-03-22 DIAGNOSIS — Z8673 Personal history of transient ischemic attack (TIA), and cerebral infarction without residual deficits: Secondary | ICD-10-CM | POA: Diagnosis not present

## 2024-03-22 DIAGNOSIS — Z79899 Other long term (current) drug therapy: Secondary | ICD-10-CM | POA: Insufficient documentation

## 2024-03-22 LAB — COMPREHENSIVE METABOLIC PANEL WITH GFR
ALT: 24 U/L (ref 0–44)
AST: 31 U/L (ref 15–41)
Albumin: 3.7 g/dL (ref 3.5–5.0)
Alkaline Phosphatase: 43 U/L (ref 38–126)
Anion gap: 12 (ref 5–15)
BUN: 13 mg/dL (ref 8–23)
CO2: 24 mmol/L (ref 22–32)
Calcium: 8.9 mg/dL (ref 8.9–10.3)
Chloride: 93 mmol/L — ABNORMAL LOW (ref 98–111)
Creatinine, Ser: 0.81 mg/dL (ref 0.44–1.00)
GFR, Estimated: >60 mL/min (ref >60)
Glucose, Bld: 97 mg/dL (ref 70–99)
Potassium: 3.8 mmol/L (ref 3.5–5.1)
Sodium: 129 mmol/L — ABNORMAL LOW (ref 135–145)
Total Bilirubin: 1 mg/dL (ref 0.0–1.2)
Total Protein: 7 g/dL (ref 6.5–8.1)

## 2024-03-22 LAB — CBC
HCT: 41.1 % (ref 36.0–46.0)
Hemoglobin: 14 g/dL (ref 12.0–15.0)
MCH: 32 pg (ref 26.0–34.0)
MCHC: 34.1 g/dL (ref 30.0–36.0)
MCV: 93.8 fL (ref 80.0–100.0)
Platelets: DECREASED 10*3/uL (ref 150–400)
RBC: 4.38 MIL/uL (ref 3.87–5.11)
RDW: 11.8 % (ref 11.5–15.5)
WBC: 5.8 10*3/uL (ref 4.0–10.5)
nRBC: 0 % (ref 0.0–0.2)

## 2024-03-22 LAB — BRAIN NATRIURETIC PEPTIDE: B Natriuretic Peptide: 214.6 pg/mL — ABNORMAL HIGH (ref 0.0–100.0)

## 2024-03-22 LAB — RESP PANEL BY RT-PCR (RSV, FLU A&B, COVID)  RVPGX2
Influenza A by PCR: NEGATIVE
Influenza B by PCR: NEGATIVE
Resp Syncytial Virus by PCR: NEGATIVE
SARS Coronavirus 2 by RT PCR: POSITIVE — AB

## 2024-03-22 LAB — I-STAT CG4 LACTIC ACID, ED: Lactic Acid, Venous: 1.1 mmol/L (ref 0.5–1.9)

## 2024-03-22 LAB — LIPASE, BLOOD: Lipase: 35 U/L (ref 11–51)

## 2024-03-22 MED ORDER — LACTATED RINGERS IV BOLUS: 1000.0000 mL | Freq: Once | INTRAVENOUS | Status: DC

## 2024-03-22 MED ORDER — ALBUTEROL SULFATE HFA 108 (90 BASE) MCG/ACT IN AERS: 1.0000 | INHALATION_SPRAY | Freq: Four times a day (QID) | RESPIRATORY_TRACT | 0 refills | Status: DC | PRN

## 2024-03-22 MED ORDER — IPRATROPIUM-ALBUTEROL 0.5-2.5 (3) MG/3ML IN SOLN
3.0000 mL | Freq: Once | RESPIRATORY_TRACT | Status: AC
  Administered 2024-03-22: 3 mL via RESPIRATORY_TRACT
  Filled 2024-03-22: qty 3

## 2024-03-22 MED ORDER — ACETAMINOPHEN 325 MG PO TABS
650.0000 mg | ORAL_TABLET | Freq: Once | ORAL | Status: AC
  Administered 2024-03-22: 650 mg via ORAL
  Filled 2024-03-22: qty 2

## 2024-03-22 MED ORDER — PAXLOVID (300/100) 20 X 150 MG & 10 X 100MG PO TBPK: 3.0000 | ORAL_TABLET | Freq: Two times a day (BID) | ORAL | 0 refills | Status: DC

## 2024-03-22 MED ORDER — ONDANSETRON HCL 4 MG/2ML IJ SOLN
4.0000 mg | Freq: Once | INTRAMUSCULAR | Status: AC
  Administered 2024-03-22: 4 mg via INTRAVENOUS
  Filled 2024-03-22: qty 2

## 2024-03-22 MED ORDER — ONDANSETRON HCL 4 MG PO TABS: 4.0000 mg | ORAL_TABLET | Freq: Four times a day (QID) | ORAL | 0 refills | Status: DC

## 2024-03-22 MED ORDER — SODIUM CHLORIDE 0.9 % IV BOLUS
1000.0000 mL | Freq: Once | INTRAVENOUS | Status: AC
  Administered 2024-03-22: 1000 mL via INTRAVENOUS

## 2024-03-22 NOTE — Discharge Instructions (Signed)
 Please follow-up with your primary care provider regards recent ER visit. Today your labs and imaging are reassuring most likely have a viral illness. Today your chest x-ray shows possible COPD and so you will need to follow-up with your primary care provider for further management of this. Please use Tylenol  every 6 hours as needed for pain. I have prescribed Zofran  in case you become nauseous. I have also put bribed albuterol inhaler for you as well. Please remain hydrated and eat food as tolerated. If symptoms change or worsen please return to the ER.

## 2024-03-22 NOTE — ED Notes (Signed)
 CCMD notified. Pt is on monitor.

## 2024-03-22 NOTE — ED Provider Notes (Signed)
 [place] [facility] DEPARTMENT AT [facility] Provider Note   CSN: [id number] Arrival date & time: 03/22/24  1108     History  Chief Complaint  Patient presents with   Shortness of Breath    Debra Roth is a 78 y.o. female history of TIA, left gross spasm, GERD, hypertension presented with cough congestion shortness of breath since last Friday.  Patient is taken Tylenol  and says this has helped.  Patient states she has been eating and drinking without issue.  Patient denies any abdominal pain, chest pain but states that she just feels generally short of breath.  Patient denies any smoking, asthma or COPD history.  Patient denies any sick contacts.  Patient is unsure of fevers at home.  Home Medications Prior to Admission medications   Medication Sig Start Date End Date Taking? Authorizing Provider  albuterol  (VENTOLIN  HFA) 108 (90 Base) MCG/ACT inhaler Inhale 1-2 puffs into the lungs every 6 (six) hours as needed for wheezing or shortness of breath. 03/22/24  Yes Jawanda Passey, Arlin Benes, PA-C  ondansetron  (ZOFRAN ) 4 MG tablet Take 1 tablet (4 mg total) by mouth every 6 (six) hours. 03/22/24  Yes Denese Finn, PA-C  acetaminophen  (TYLENOL ) 650 MG CR tablet Take 650-1,300 mg by mouth daily as needed for pain.    [provider]  botulinum toxin Type A (BOTOX) 100 units SOLR injection Inject 33.5 Units as directed every 3 (three) months. 08/08/16   [provider]  buPROPion  (WELLBUTRIN  XL) 150 MG 24 hr tablet TAKE 1 TABLET (150 MG TOTAL) BY MOUTH DAILY. Patient taking differently: Take 150 mg by mouth every evening. 12/16/16   Bettie Bruce, PA-C  cetirizine (ZYRTEC) 10 MG tablet Take 10 mg by mouth daily as needed for allergies.    [provider]  diazepam  (VALIUM ) 5 MG tablet Take one at night and one additional in daytime if needed for anxiety and panic Patient taking differently: Take 5 mg by mouth 2 (two) times daily as needed for anxiety.  07/05/16   Dain Drown, MD  hydrochlorothiazide  (HYDRODIURIL ) 25 MG tablet Take 25 mg by mouth daily.    [provider]  HYDROcodone -acetaminophen  (NORCO/VICODIN) 5-325 MG tablet Take 1 tablet by mouth every 4 (four) hours as needed for moderate pain ((score 4 to 6)). 07/15/23   Agustina Aldrich, MD  ibuprofen (ADVIL) 200 MG tablet Take 400 mg by mouth daily as needed for moderate pain.    [provider]  lidocaine  (LIDODERM ) 5 % Place 1 patch onto the skin daily. Remove & Discard patch within 12 hours or as directed by MD Patient not taking: Reported on 07/01/2023 06/04/23   Raspet, Erin K, PA-C  loperamide  (IMODIUM ) 2 MG capsule Take 2-4 mg by mouth as needed for diarrhea or loose stools.    [provider]  meclizine  (ANTIVERT ) 12.5 MG tablet Take 12.5 mg by mouth 3 (three) times daily as needed for dizziness.    [provider]  methocarbamol  (ROBAXIN ) 500 MG tablet Take 1 tablet (500 mg total) by mouth every 6 (six) hours as needed for muscle spasms. 07/15/23   Agustina Aldrich, MD  metoprolol  tartrate (LOPRESSOR ) 25 MG tablet TAKE 1 TABLET (25 MG TOTAL) BY MOUTH DAILY. Patient taking differently: Take 25 mg by mouth 2 (two) times daily. 12/14/16   Aldine Humphreys, PA  omeprazole (PRILOSEC) 20 MG capsule Take 20 mg by mouth daily as needed (acid reflux).    [provider]  Polyethyl Glycol-Propyl  Glycol (SYSTANE OP) Place 1 drop into both eyes daily as needed (dry eyes).    [provider]  pravastatin  (PRAVACHOL ) 20 MG tablet Take 20 mg by mouth at bedtime.    [provider]  ramipril  (ALTACE ) 10 MG capsule Take 10 mg by mouth daily.    [provider]  tetrahydrozoline-zinc (VISINE-AC) 0.05-0.25 % ophthalmic solution Place 1 drop into both eyes daily as needed (allergies).    [provider]      Allergies    Egg solids, whole and Lactose    Review of Systems   Review of Systems  Respiratory:  Positive for  shortness of breath.     Physical Exam Updated Vital Signs BP (!) 129/56   Pulse 63   Temp 98.7 F (37.1 C) (Oral)   Resp 17   SpO2 100%  Physical Exam Vitals reviewed.  Constitutional:      General: She is in acute distress.     Comments: Actively having watery emesis  HENT:     Head: Normocephalic and atraumatic.  Eyes:     Extraocular Movements: Extraocular movements intact.     Conjunctiva/sclera: Conjunctivae normal.     Pupils: Pupils are equal, round, and reactive to light.  Cardiovascular:     Rate and Rhythm: Normal rate and regular rhythm.     Pulses: Normal pulses.     Heart sounds: Normal heart sounds.     Comments: 2+ bilateral radial/dorsalis pedis pulses with regular rate Pulmonary:     Effort: Pulmonary effort is normal. No respiratory distress.     Breath sounds: Examination of the right-upper field reveals decreased breath sounds. Examination of the right-middle field reveals decreased breath sounds. Examination of the right-lower field reveals decreased breath sounds. Decreased breath sounds present.  Abdominal:     Palpations: Abdomen is soft.     Tenderness: There is no abdominal tenderness. There is no guarding or rebound.  Musculoskeletal:        General: Normal range of motion.     Cervical back: Normal range of motion and neck supple.     Comments: 5 out of 5 bilateral grip/leg extension strength  Skin:    General: Skin is warm and dry.     Capillary Refill: Capillary refill takes less than 2 seconds.  Neurological:     General: No focal deficit present.     Mental Status: She is alert and oriented to person, place, and time.     Comments: Sensation intact in all 4 limbs  Psychiatric:        Mood and Affect: Mood normal.     ED Results / Procedures / Treatments   Labs (all labs ordered are listed, but only abnormal results are displayed) Labs Reviewed  RESP PANEL BY RT-PCR (RSV, FLU A&B, COVID)  RVPGX2 - Abnormal; Notable for the following  components:      Result Value   SARS Coronavirus 2 by RT PCR POSITIVE (*)    All other components within normal limits  COMPREHENSIVE METABOLIC PANEL WITH GFR - Abnormal; Notable for the following components:   Sodium 129 (*)    Chloride 93 (*)    All other components within normal limits  BRAIN NATRIURETIC PEPTIDE - Abnormal; Notable for the following components:   B Natriuretic Peptide 214.6 (*)    All other components within normal limits  CBC  LIPASE, BLOOD  I-STAT CG4 LACTIC ACID, ED    EKG EKG Interpretation Date/Time:  Monday  Mar 22 2024 11:19:24 EDT Ventricular Rate:  60 PR Interval:  168 QRS Duration:  72 QT Interval:  394 QTC Calculation: 394 R Axis:   0  Text Interpretation: Sinus rhythm with marked sinus arrhythmia Low voltage QRS Cannot rule out Anterior infarct , age undetermined Abnormal ECG When compared with ECG of 08-Jul-2023 15:24, PREVIOUS ECG IS PRESENT Confirmed by Shyrl Doyne [id number]) on 03/22/2024 12:52:45 PM  Radiology DG Chest Port 1 View Result Date: 03/22/2024 CLINICAL DATA:  Shortness of breath EXAM: PORTABLE CHEST 1 VIEW COMPARISON:  June 04, 2023 FINDINGS: The heart size and mediastinal contours are within normal limits. Both lungs are clear. The visualized skeletal structures are unremarkable. IMPRESSION: No active disease.  COPD Electronically Signed   By: Fredrich Jefferson M.D.   On: 03/22/2024 12:45    Procedures Procedures    Medications Ordered in ED Medications  acetaminophen  (TYLENOL ) tablet 650 mg (650 mg Oral Given 03/22/24 1303)  ondansetron  (ZOFRAN ) injection 4 mg (4 mg Intravenous Given 03/22/24 1304)  sodium chloride  0.9 % bolus 1,000 mL (0 mLs Intravenous Stopped 03/22/24 1547)  ipratropium-albuterol  (DUONEB) 0.5-2.5 (3) MG/3ML nebulizer solution 3 mL (3 mLs Nebulization Given 03/22/24 1305)    ED Course/ Medical Decision Making/ A&P                                 Medical Decision Making Amount and/or Complexity of Data  Reviewed Labs: ordered. Radiology: ordered.  Risk OTC drugs. Prescription drug management.   Debra Roth 78 y.o. presented today for shortness of breath.  Working DDx that I considered at this time includes, but not limited to, asthma/COPD exacerbation, URI, viral illness, anemia, ACS, PE, pneumonia, pleural effusion, lung cancer, CHF, respiratory distress, medication side effect, intoxication.  R/o DDx: Asthma exacerbation, URI, anemia, ACS, PE, pneumonia, pleural effusion, lung cancer, CHF, respiratory distress, medication side effect, intoxication: These are considered less likely due to history of present illness, physical exam, labs/imaging findings  Review of prior external notes: 03/10/24 Office Visit  Unique Tests and My Independent Interpretation:  CBC: Unremarkable CMP: Mild hyponatremia 129 EKG: Sinus 60 bpm, no ST elevations or depressions noted BNP: 214.6 CXR: No acute pathology however possible COPD noted Respiratory panel: COVID-positive Lactic acid: Unremarkable Lipase: Unremarkable  Social Determinants of Health: none  Discussion with Independent Historian:  daughter  Discussion of Management of Tests: None  Risk: Medium: prescription drug management  Risk Stratification Score: None  Staffed with Efraim Grange, MD  Plan: On exam patient was acute distress and noted to be febrile 100.9 F so we will give Tylenol . Physical exam showed decreased breath sounds on the right side and patient was having active watery emesis. The cardiac monitor was ordered secondary to the patient's history of shortness of breath and to monitor the patient for dysrhythmia. Cardiac monitor by my independent interpretation showed normal sinus.  Patient's history sounds consistent with a viral illness as she states it began with cough and congestion and that she is coughing up green sputum now.  When I went in the room patient was having watery emesis and so we will give Zofran  along  with fluids and check abdominal labs as well.  Patient did test positive for COVID which would explain her symptoms.  Attending was evaluated by myself and the attending and we agree the symptoms are most likely secondary to her COVID.  The attending did research patient's medications and  spoke to the patient about Paxlovid  which patient states she would like a prescription sent but is unsure if she will use it as she is within the first 5 days.  Will send prescription.  Will also send for Zofran .  Will ambulate patient get orthostatics as well as patient did get lightheaded when going from a lying to an upright position and in the setting of COVID and not having great p.o. intake do feel this is orthostatic.  Patient endorsing any neurologic deficits that would necessitate further imaging.  On reassessment after the Zofran  fluids patient does look much better than when I initially evaluated her.  I did discuss with the patient the chest x-ray findings and that she will need to further follow-up with her primary care provider to be worked up for suspected COPD which she agreed.  Patient is able to tolerate p.o. and ambulate without difficulty and does not become hypoxic.  Patient has improved fluids here and when I went to go reassess her patient states she no longer wants the prescription for the Paxil bid and would rather just take Tylenol  every 6 hours and would like an albuterol  inhaler prescribed which we will do.  I did recommend that she follows up with her primary care provider and stressed the importance of fluids and will prescribe Zofran  as well.  Patient and daughter are comfortable with this plan.  Patient was given return precautions. Patient stable for discharge at this time.  Patient verbalized understanding of plan.  This chart was dictated using voice recognition software.  Despite best efforts to proofread,  errors can occur which can change the documentation  meaning.         Final Clinical Impression(s) / ED Diagnoses Final diagnoses:  COVID    Rx / DC Orders ED Discharge Orders          Ordered    ondansetron  (ZOFRAN ) 4 MG tablet  Every 6 hours        03/22/24 1526    nirmatrelvir/ritonavir (PAXLOVID , 300/100,) 20 x 150 MG & 10 x 100MG  TBPK  2 times daily,   Status:  Discontinued        03/22/24 1526    albuterol  (VENTOLIN  HFA) 108 (90 Base) MCG/ACT inhaler  Every 6 hours PRN        03/22/24 1710              Alyssa Mancera T, PA-C 03/22/24 1724    Ninetta Basket, MD 03/28/24 2156

## 2024-03-22 NOTE — ED Notes (Signed)
 Patient elevated well in hallway. O2 was 100% while ambulating.

## 2024-03-22 NOTE — ED Triage Notes (Signed)
 Pt BIB by EMS reporting SHOB and HA since Saturday. Diminished lung sounds, no baseline oxygen requirements. Alert and oriented on arrival.  EMS VS: BP 156/98 HR 64  96% RA

## 2024-05-14 ENCOUNTER — Ambulatory Visit (INDEPENDENT_AMBULATORY_CARE_PROVIDER_SITE_OTHER): Admitting: Obstetrics

## 2024-05-14 ENCOUNTER — Encounter: Payer: Self-pay | Admitting: Obstetrics

## 2024-05-14 VITALS — BP 157/62 | HR 53 | Ht 60.63 in | Wt 136.8 lb

## 2024-05-14 DIAGNOSIS — N362 Urethral caruncle: Secondary | ICD-10-CM | POA: Insufficient documentation

## 2024-05-14 DIAGNOSIS — R159 Full incontinence of feces: Secondary | ICD-10-CM | POA: Insufficient documentation

## 2024-05-14 DIAGNOSIS — R102 Pelvic and perineal pain: Secondary | ICD-10-CM | POA: Insufficient documentation

## 2024-05-14 DIAGNOSIS — N3946 Mixed incontinence: Secondary | ICD-10-CM | POA: Diagnosis not present

## 2024-05-14 LAB — POCT URINALYSIS DIP (CLINITEK)
Bilirubin, UA: NEGATIVE
Glucose, UA: NEGATIVE mg/dL
Ketones, POC UA: NEGATIVE mg/dL
Nitrite, UA: NEGATIVE
POC PROTEIN,UA: NEGATIVE
Spec Grav, UA: 1.015 (ref 1.010–1.025)
Urobilinogen, UA: 0.2 U/dL
pH, UA: 6 (ref 5.0–8.0)

## 2024-05-14 MED ORDER — GEMTESA 75 MG PO TABS: 75.0000 mg | ORAL_TABLET | Freq: Every day | ORAL | Status: AC

## 2024-05-14 MED ORDER — GEMTESA 75 MG PO TABS: 75.0000 mg | ORAL_TABLET | Freq: Every day | ORAL | 2 refills | Status: AC
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
## 2024-05-14 NOTE — Patient Instructions (Addendum)
 You have a urethral caruncle due to low estrogen status.  For vaginal atrophy (thinning of the vaginal tissue that can cause dryness and burning) and UTI prevention we discussed estrogen replacement in the form of vaginal cream.   Start vaginal estrogen therapy nightly for two weeks then 2 times weekly at night. This can be placed with your finger or an applicator inside the vagina and around the urethra.  Please let us  know if the prescription is too expensive and we can look for alternative options.   - discussed proper vulvar care, warm compression, avoid pad use, cotton only underwear and barrier ointment if needed   Is vaginal estrogen therapy safe for me? Vaginal estrogen preparations act on the vaginal skin, and only a very tiny amount is absorbed into the bloodstream (0.01%).  They work in a similar way to hand or face cream.  There is minimal absorption and they are therefore perfectly safe. If you have had breast cancer and have persistent troublesome symptoms which aren't settling with vaginal moisturisers and lubricants, local estrogen treatment may be a possibility, but consultation with your oncologist should take place first.   We discussed the symptoms of overactive bladder (OAB), which include urinary urgency, urinary frequency, night-time urination, with or without urge incontinence.  We discussed management including behavioral therapy (decreasing bladder irritants by following a bladder diet, urge suppression strategies, timed voids, bladder retraining), physical therapy, medication; and for refractory cases posterior tibial nerve stimulation, sacral neuromodulation, and intravesical botulinum toxin injection.   For Beta-3 agonist medication, we discussed the potential side effect of elevated blood pressure which is more likely to occur in individuals with uncontrolled hypertension. You were given samples for Gemtesa 75 mg.  It can take a month to start working so give it time, but  if you have bothersome side effects call sooner and we can try a different medication.  Call us  if you have trouble filling the prescription or if it's not covered by your insurance.  For treatment of stress urinary incontinence,  non-surgical options include expectant management, weight loss, physical therapy, as well as a pessary.  Surgical options include a midurethral sling, Burch urethropexy, and transurethral injection of a bulking agent.  Women should try to eat at least 21 to 25 grams of fiber a day, while men should aim for 30 to 38 grams a day. You can add fiber to your diet with food or a fiber supplement such as psyllium (metamucil), benefiber, or fibercon.   Here's a look at how much dietary fiber is found in some common foods. When buying packaged foods, check the Nutrition Facts label for fiber content. It can vary among brands.  Fruits Serving size Total fiber (grams)*  Raspberries 1 cup 8.0  Pear 1 medium 5.5  Apple, with skin 1 medium 4.5  Banana 1 medium 3.0  Orange 1 medium 3.0  Strawberries 1 cup 3.0   Vegetables Serving size Total fiber (grams)*  Green peas, boiled 1 cup 9.0  Broccoli, boiled 1 cup chopped 5.0  Turnip greens, boiled 1 cup 5.0  Brussels sprouts, boiled 1 cup 4.0  Potato, with skin, baked 1 medium 4.0  Sweet corn, boiled 1 cup 3.5  Cauliflower, raw 1 cup chopped 2.0  Carrot, raw 1 medium 1.5   Grains Serving size Total fiber (grams)*  Spaghetti, whole-wheat, cooked 1 cup 6.0  Barley, pearled, cooked 1 cup 6.0  Bran flakes 3/4 cup 5.5  Quinoa, cooked 1 cup 5.0  Oat bran  muffin 1 medium 5.0  Oatmeal, instant, cooked 1 cup 5.0  Popcorn, air-popped 3 cups 3.5  Brown rice, cooked 1 cup 3.5  Bread, whole-wheat 1 slice 2.0  Bread, rye 1 slice 2.0   Legumes, nuts and seeds Serving size Total fiber (grams)*  Split peas, boiled 1 cup 16.0  Lentils, boiled 1 cup 15.5  Black beans, boiled 1 cup 15.0  Baked beans, canned 1 cup 10.0  Chia seeds 1  ounce 10.0  Almonds 1 ounce (23 nuts) 3.5  Pistachios 1 ounce (49 nuts) 3.0  Sunflower kernels 1 ounce 3.0  *Rounded to nearest 0.5 gram. Source: [employer] for Standard Reference, Legacy Release    Accidental Bowel Leakage:  - Treatment options include anti-diarrhea medication (loperamide / Imodium  OTC or prescription lomotil), fiber supplements, physical therapy, and possible sacral neuromodulation or surgery.    The origin of pelvic floor muscle spasm can be multifactorial, including primary, reactive to a different pain source, trauma, or even part of a centralized pain syndrome.Treatment options include pelvic floor physical therapy, local (vaginal) or oral  muscle relaxants, pelvic muscle trigger point injections or centrally acting pain medications.

## 2024-05-14 NOTE — Assessment & Plan Note (Addendum)
-   reviewed exam finding and provided mirror for self examination during exam - PVR 21mL WNL and denies sensation of incomplete emptying. Declined catheterization - denies pain with palpation, bleeding - Rx to start low dose vaginal estrogen - We discussed the potential risks associated with hormone replacement including stroke, heart attack, and blood clots; and the fact that these risks are very low with vaginal estrogen use due to the very low systemic absorption rate of ~ 0.01% with a twice-week regimen. - re-assess symptoms in 3 months - discussed proper vulvar care, warm compression, avoid pad use, cotton only underwear and barrier ointment if needed

## 2024-05-14 NOTE — Assessment & Plan Note (Addendum)
-   Treatment options include anti-diarrhea medication (loperamide / Imodium  OTC or prescription lomotil), fiber supplements, physical therapy, and possible sacral neuromodulation or surgery.   - encouraged fiber supplementation, squatting position for defecation - encouraged pt to consider pelvic floor PT - continue PRN imodium  use

## 2024-05-14 NOTE — Assessment & Plan Note (Signed)
-   h/o lumbar stenosis s/p bilateral L4-5 decompressive laminotomies with foraminotomies by Dr. Louis in 07/14/23 - bilateral pelvic floor myofascial pain with palpation - take Valium  daily - The origin of pelvic floor muscle spasm can be multifactorial, including primary, reactive to a different pain source, trauma, or even part of a centralized pain syndrome.Treatment options include pelvic floor physical therapy, local (vaginal) or oral  muscle relaxants, pelvic muscle trigger point injections or centrally acting pain medications.   - encouraged pt to consider pelvic floor PT

## 2024-05-14 NOTE — Assessment & Plan Note (Signed)
-   POCT + heme/leuk, prior PCP urine testing negative for heme on 04/29/24 - office to call for patient to drop off urine sample for repeat testing and culture - We discussed the symptoms of overactive bladder (OAB), which include urinary urgency, urinary frequency, nocturia, with or without urge incontinence.  While we do not know the exact etiology of OAB, several treatment options exist. We discussed management including behavioral therapy (decreasing bladder irritants, urge suppression strategies, timed voids, bladder retraining), physical therapy, medication; for refractory cases posterior tibial nerve stimulation, sacral neuromodulation, and intravesical botulinum toxin injection.  For anticholinergic medications, we discussed the potential side effects of anticholinergics including dry eyes, dry mouth, constipation, cognitive impairment and urinary retention. For Beta-3 agonist medication, we discussed the potential side effect of elevated blood pressure which is more likely to occur in individuals with uncontrolled hypertension. - samples and Rx for trial of gemtesa  For treatment of stress urinary incontinence,  non-surgical options include expectant management, weight loss, physical therapy, as well as a pessary.  Surgical options include a midurethral sling, Burch urethropexy, and transurethral injection of a bulking agent. - encouraged pt to consider pelvic floor PT referral

## 2024-06-30 ENCOUNTER — Ambulatory Visit: Admitting: Obstetrics and Gynecology

## 2024-08-19 ENCOUNTER — Ambulatory Visit: Admitting: Obstetrics
